# Patient Record
Sex: Female | Born: 1965 | Marital: Married | Smoking: Never smoker
Health system: Southern US, Community
[De-identification: ages and names within clinical notes are randomized; demographics above are authoritative.]

## PROBLEM LIST (undated history)

## (undated) DIAGNOSIS — I10 Essential (primary) hypertension: Secondary | ICD-10-CM

## (undated) DIAGNOSIS — R7303 Prediabetes: Secondary | ICD-10-CM

## (undated) HISTORY — DX: Prediabetes: R73.03

## (undated) HISTORY — DX: Essential (primary) hypertension: I10

---

## 2006-06-14 DIAGNOSIS — I10 Essential (primary) hypertension: Secondary | ICD-10-CM

## 2006-06-14 HISTORY — DX: Essential (primary) hypertension: I10

## 2014-01-17 ENCOUNTER — Emergency Department (HOSPITAL_COMMUNITY)
Admission: EM | Admit: 2014-01-17 | Discharge: 2014-01-17 | Disposition: A | Discharge status: Home / Self Care | Payer: Self-pay | Source: Home / Self Care | Attending: Family Medicine | Admitting: Family Medicine

## 2014-01-17 ENCOUNTER — Encounter (HOSPITAL_COMMUNITY): Payer: Self-pay | Admitting: Emergency Medicine

## 2014-01-17 DIAGNOSIS — R42 Dizziness and giddiness: Secondary | ICD-10-CM

## 2014-01-17 LAB — POCT I-STAT, CHEM 8
BUN: 16 mg/dL (ref 6–23)
Calcium, Ion: 1.27 mmol/L — ABNORMAL HIGH (ref 1.12–1.23)
Chloride: 105 mEq/L (ref 96–112)
Creatinine, Ser: 1 mg/dL (ref 0.50–1.10)
GLUCOSE: 83 mg/dL (ref 70–99)
HEMATOCRIT: 41 % (ref 36.0–46.0)
Hemoglobin: 13.9 g/dL (ref 12.0–15.0)
POTASSIUM: 4 meq/L (ref 3.7–5.3)
Sodium: 142 mEq/L (ref 137–147)
TCO2: 25 mmol/L (ref 0–100)

## 2014-01-17 LAB — HEMOGLOBIN A1C
HEMOGLOBIN A1C: 6.2 % — AB (ref <5.7)
Mean Plasma Glucose: 131 mg/dL — ABNORMAL HIGH (ref <117)

## 2014-01-17 LAB — TSH: TSH: 1.64 u[IU]/mL (ref 0.350–4.500)

## 2014-01-17 NOTE — Discharge Instructions (Signed)
If your other lab tests come back abnormal, a nurse will call you. If your tests are normal, you will not get a phone call.   Follow up with the [facility] for future health care. They will work with you without health insurance.    Dizziness Dizziness is a common problem. It is a feeling of unsteadiness or light-headedness. You may feel like you are about to faint. Dizziness can lead to injury if you stumble or fall. A person of any age group can suffer from dizziness, but dizziness is more common in older adults. CAUSES  Dizziness can be caused by many different things, including:  Middle ear problems.  Standing for too long.  Infections.  An allergic reaction.  Aging.  An emotional response to something, such as the sight of blood.  Side effects of medicines.  Tiredness.  Problems with circulation or blood pressure.  Excessive use of alcohol or medicines, or illegal drug use.  Breathing too fast (hyperventilation).  An irregular heart rhythm (arrhythmia).  A low red blood cell count (anemia).  Pregnancy.  Vomiting, diarrhea, fever, or other illnesses that cause body fluid loss (dehydration).  Diseases or conditions such as Parkinson's disease, high blood pressure (hypertension), diabetes, and thyroid problems.  Exposure to extreme heat. DIAGNOSIS  Your health care provider will ask about your symptoms, perform a physical exam, and perform an electrocardiogram (ECG) to record the electrical activity of your heart. Your health care provider may also perform other heart or blood tests to determine the cause of your dizziness. These may include:  Transthoracic echocardiogram (TTE). During echocardiography, sound waves are used to evaluate how blood flows through your heart.  Transesophageal echocardiogram (TEE).  Cardiac monitoring. This allows your health care provider to monitor your heart rate and rhythm in real time.  Holter  monitor. This is a portable device that records your heartbeat and can help diagnose heart arrhythmias. It allows your health care provider to track your heart activity for several days if needed.  Stress tests by exercise or by giving medicine that makes the heart beat faster. TREATMENT  Treatment of dizziness depends on the cause of your symptoms and can vary greatly. HOME CARE INSTRUCTIONS   Drink enough fluids to keep your urine clear or pale yellow. This is especially important in very hot weather. In older adults, it is also important in cold weather.  Take your medicine exactly as directed if your dizziness is caused by medicines. When taking blood pressure medicines, it is especially important to get up slowly.  Rise slowly from chairs and steady yourself until you feel okay.  In the morning, first sit up on the side of the bed. When you feel okay, stand slowly while holding onto something until you know your balance is fine.  Move your legs often if you need to stand in one place for a long time. Tighten and relax your muscles in your legs while standing.  Have someone stay with you for 1-2 days if dizziness continues to be a problem. Do this until you feel you are well enough to stay alone. Have the person call your health care provider if he or she notices changes in you that are concerning.  Do not drive or use heavy machinery if you feel dizzy.  Do not drink alcohol. SEEK IMMEDIATE MEDICAL CARE IF:   Your dizziness or light-headedness gets worse.  You feel nauseous or vomit.  You have problems talking, walking, or  using your arms, hands, or legs.  You feel weak.  You are not thinking clearly or you have trouble forming sentences. It may take a friend or family member to notice this.  You have chest pain, abdominal pain, shortness of breath, or sweating.  Your vision changes.  You notice any bleeding.  You have side effects from medicine that seems to be getting  worse rather than better. MAKE SURE YOU:   Understand these instructions.  Will watch your condition.  Will get help right away if you are not doing well or get worse. Document Released: 11/24/2000 Document Revised: 06/05/2013 Document Reviewed: 12/18/2010 [facility] Patient Information 2015 [place], [place]. This information is not intended to replace advice given to you by your health care provider. Make sure you discuss any questions you have with your health care provider.

## 2014-01-17 NOTE — ED Provider Notes (Signed)
CSN: [id number]     Arrival date & time 01/17/14  1040 History   First MD Initiated Contact with Patient 01/17/14 1224     Chief Complaint  Patient presents with  . Headache   (Consider location/radiation/quality/duration/timing/severity/associated sxs/prior Treatment) HPI Comments: Pt reports her blood sugar was "a little high" last year after loosing weight unexpectedly.  She has changed her diet to reduce carbs and has continued to lose weight unintentionally. She also has had occasional lightheadedness on and off for months. She has noticed it more in the last 2 days and wants her blood sugar checked again because she thinks she must have diabetes. Lightheadedness does not interfere with life, she just notices it sometimes. Does not have pcp or insurance.   Patient is a 48 y.o. female presenting with dizziness. The history is provided by the patient.  Dizziness Quality:  Lightheadedness Severity:  Mild Onset quality:  Sudden Duration: on and off for several months. Timing:  Sporadic Progression:  Unchanged Chronicity:  New Context: not when bending over, not with head movement, not with loss of consciousness, not with medication and not with physical activity   Relieved by:  None tried Worsened by:  Nothing tried Ineffective treatments:  None tried Associated symptoms: no chest pain, no diarrhea, no headaches, no palpitations, no shortness of breath, no syncope, no vomiting and no weakness     History reviewed. No pertinent past medical history. Past Surgical History  Procedure Laterality Date  . Cesarean section     History reviewed. No pertinent family history. History  Substance Use Topics  . Smoking status: Never Smoker   . Smokeless tobacco: Not on file  . Alcohol Use: No   OB History   Grav Para Term Preterm Abortions TAB SAB Ect Mult Living                 Review of Systems  Constitutional: Negative for chills, activity change, appetite change and fatigue.   Respiratory: Negative for shortness of breath.   Cardiovascular: Negative for chest pain, palpitations and syncope.  Gastrointestinal: Negative for vomiting and diarrhea.  Endocrine: Positive for polydipsia and polyuria. Negative for cold intolerance, heat intolerance and polyphagia.  Neurological: Positive for dizziness and light-headedness. Negative for syncope and headaches.    Allergies  Review of patient's allergies indicates no known allergies.  Home Medications   Prior to Admission medications   Not on File   BP 138/97  Pulse 75  Temp(Src) 98.6 F (37 C) (Oral)  Resp 18  SpO2 100%  LMP 01/17/2014 Physical Exam  Constitutional: She appears well-developed and well-nourished.  Neck: Neck supple. No thyromegaly present.  Cardiovascular: Normal rate and regular rhythm.   No peripheral edema  Pulmonary/Chest: Effort normal and breath sounds normal.  Lymphadenopathy:    She has no cervical adenopathy.    ED Course  Procedures (including critical care time) Labs Review Labs Reviewed  POCT I-STAT, CHEM 8 - Abnormal; Notable for the following:    Calcium, Ion 1.27 (*)    All other components within normal limits  TSH  HEMOGLOBIN A1C    Imaging Review No results found.   MDM   1. Dizziness   glucose, hgb/hct normal on istat. Not orthostatic. Drew TSH and hgbA1C. [facility] for further care.      Cathlyn ParsonsAngela M Kabbe, NP 01/17/14 1230

## 2014-01-17 NOTE — ED Notes (Signed)
Pt  Reports  Symptoms  Of  Headache   /  dizzyness        X  2  Days   -  Wants tio  Be  Checked  For  Diabetes      Headache  Is  Mild  -  No  Photophobia      No  Vomiting     Pt is  Awake  And  Alert  And  Oriented         Skin is  Warm  And  Dry

## 2014-01-21 NOTE — ED Notes (Signed)
TSH 1.640, Hgb A 1 C 6.2 H, mean glucose 131 H.  Message sent to Dr. Denyse Amassorey. Vassie MoselleYork, Milana Salay M 01/21/2014

## 2014-01-23 ENCOUNTER — Telehealth (HOSPITAL_COMMUNITY): Payer: Self-pay | Admitting: *Deleted

## 2014-01-23 NOTE — ED Notes (Signed)
Dr. Denyse Amassorey said to notify the pt. of her results.  I called pt.  Pt. verified x 2 and given results.  Pt. told that her Hgb A1C shows she is pre-diabetic. I said it would have to be greater that 6.5 to be diabetes, but she needs to f/u with her PCP if she has one.  She said she sees Dr. Julio Sickssei Bonsu. (I added his name to her chart as her PCP).   I told her that if he is not on EPIC that we could fax her lab results to him.  She voiced understanding. Vassie MoselleYork, Nikoloz Huy M 01/23/2014

## 2014-02-05 NOTE — ED Provider Notes (Signed)
Medical screening examination/treatment/procedure(s) were performed by a resident physician or non-physician practitioner and as the supervising physician I was immediately available for consultation/collaboration.  Clementeen Graham, MD   Rodolph Bong, MD 02/05/14 [phone]

## 2014-02-13 ENCOUNTER — Ambulatory Visit: Payer: Self-pay

## 2014-08-19 ENCOUNTER — Emergency Department (INDEPENDENT_AMBULATORY_CARE_PROVIDER_SITE_OTHER)
Admission: EM | Admit: 2014-08-19 | Discharge: 2014-08-19 | Disposition: A | Discharge status: Home / Self Care | Payer: Self-pay | Source: Home / Self Care | Attending: Family Medicine | Admitting: Family Medicine

## 2014-08-19 ENCOUNTER — Encounter (HOSPITAL_COMMUNITY): Payer: Self-pay | Admitting: *Deleted

## 2014-08-19 DIAGNOSIS — Z041 Encounter for examination and observation following transport accident: Secondary | ICD-10-CM

## 2014-08-19 DIAGNOSIS — M25531 Pain in right wrist: Secondary | ICD-10-CM
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
## 2014-08-19 NOTE — Discharge Instructions (Signed)
Wear splint as needed, see dr Mina Marbleweingold if further wrist problems.
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
## 2014-08-26 ENCOUNTER — Other Ambulatory Visit: Payer: Self-pay

## 2014-08-26 DIAGNOSIS — Z1231 Encounter for screening mammogram for malignant neoplasm of breast: Secondary | ICD-10-CM

## 2015-02-14 ENCOUNTER — Ambulatory Visit: Payer: Self-pay | Attending: Internal Medicine

## 2015-02-24 ENCOUNTER — Ambulatory Visit: Payer: Self-pay | Attending: Internal Medicine

## 2015-04-10 ENCOUNTER — Ambulatory Visit: Payer: Self-pay | Attending: Internal Medicine | Admitting: Internal Medicine

## 2015-04-10 ENCOUNTER — Encounter: Payer: Self-pay | Admitting: Internal Medicine

## 2015-04-10 VITALS — BP 144/94 | HR 81 | Temp 98.8°F | Resp 16 | Wt 204.0 lb

## 2015-04-10 DIAGNOSIS — Z8632 Personal history of gestational diabetes: Secondary | ICD-10-CM | POA: Insufficient documentation

## 2015-04-10 DIAGNOSIS — Z0000 Encounter for general adult medical examination without abnormal findings: Secondary | ICD-10-CM

## 2015-04-10 DIAGNOSIS — Z23 Encounter for immunization: Secondary | ICD-10-CM | POA: Insufficient documentation

## 2015-04-10 DIAGNOSIS — R7303 Prediabetes: Secondary | ICD-10-CM | POA: Insufficient documentation

## 2015-04-10 LAB — POCT GLYCOSYLATED HEMOGLOBIN (HGB A1C): Hemoglobin A1C: 6

## 2015-04-10 NOTE — Progress Notes (Signed)
Patient here to establish care Currently taking no prescribed medications Patient did state she has been diagnosed with diabetes but Takes no medication for it Controls it with diet and exercise

## 2015-04-10 NOTE — Progress Notes (Signed)
Patient ID: Zakari Couchman, female   DOB: Mar 09, 1966, 49 y.o.   MRN: [id number]  CC: wellness check  HPI: Kay Shippy is a 49 y.o. female here today for a follow up visit.  Patient has past medical history of gestational diabetes. She reports that 6 years ago while pregnant she was diagnosed with gestational diabetes which resolved after giving birth. She states that she was checked again and was told that she needed to lose weight and change her diet to lower her A1C. Today she is requesting to be checked to see if she needs medications. She denies polyuria, urinary frequency, blurred vision, or neuropathy.  Her only complaint is fatigued but she had a full physical in January and states that all of her blood test were normal. No complaints today.  No Known Allergies Past Medical History  Diagnosis Date  . Diabetes mellitus without complication (HCC)    No current outpatient prescriptions on file prior to visit.   No current facility-administered medications on file prior to visit.   History reviewed. No pertinent family history. Social History   Social History  . Marital Status: Married    Spouse Name: N/A  . Number of Children: N/A  . Years of Education: N/A   Occupational History  . Not on file.   Social History Main Topics  . Smoking status: Never Smoker   . Smokeless tobacco: Not on file  . Alcohol Use: No  . Drug Use: Not on file  . Sexual Activity: Not on file   Other Topics Concern  . Not on file   Social History Narrative    Review of Systems: Constitutional: Negative for fever, chills, diaphoresis, activity change, appetite change and fatigue. HENT: Negative for ear pain, nosebleeds, congestion, facial swelling, rhinorrhea, neck pain, neck stiffness and ear discharge.  Eyes: Negative for pain, discharge, redness, itching and visual disturbance. Respiratory: Negative for cough, choking, chest tightness, shortness of breath, wheezing and stridor.   Cardiovascular: Negative for chest pain, palpitations and leg swelling. Gastrointestinal: Negative for abdominal distention. Genitourinary: Negative for dysuria, urgency, frequency, hematuria, flank pain, decreased urine volume, difficulty urinating and dyspareunia.  Musculoskeletal: Negative for back pain, joint swelling, arthralgias and gait problem. Neurological: Negative for dizziness, tremors, seizures, syncope, facial asymmetry, speech difficulty, weakness, light-headedness, numbness and headaches.  Hematological: Negative for adenopathy. Does not bruise/bleed easily. Psychiatric/Behavioral: Negative for hallucinations, behavioral problems, confusion, dysphoric mood, decreased concentration and agitation.    Objective:   Filed Vitals:   04/10/15 1101  BP: 144/94  Pulse: 81  Temp: 98.8 F (37.1 C)  Resp: 16    Physical Exam: Constitutional: Patient appears well-developed and well-nourished. No distress. HENT: Normocephalic, atraumatic, External right and left ear normal. Oropharynx is clear and moist.  Eyes: Conjunctivae and EOM are normal. PERRLA, no scleral icterus. Neck: Normal ROM. Neck supple. No JVD. No tracheal deviation. No thyromegaly. CVS: RRR, S1/S2 +, no murmurs, no gallops, no carotid bruit.  Pulmonary: Effort and breath sounds normal, no stridor, rhonchi, wheezes, rales.  Abdominal: Soft. BS +,  no distension, tenderness, rebound or guarding.  Musculoskeletal: Normal range of motion. No edema and no tenderness.  Lymphadenopathy: No lymphadenopathy noted, cervical, inguinal or axillary Neuro: Alert. Normal reflexes, muscle tone coordination. No cranial nerve deficit. Skin: Skin is warm and dry. No rash noted. Not diaphoretic. No erythema. No pallor. Psychiatric: Normal mood and affect. Behavior, judgment, thought content normal.  Lab Results  Component Value Date   HGB 13.9 01/17/2014   HCT  41.0 01/17/2014   Lab Results  Component Value Date   CREATININE  1.00 01/17/2014   BUN 16 01/17/2014   NA 142 01/17/2014   K 4.0 01/17/2014   CL 105 01/17/2014    Lab Results  Component Value Date   HGBA1C 6.0 04/10/2015   Lipid Panel  No results found for: CHOL, TRIG, HDL, CHOLHDL, VLDL, LDLCALC     Assessment and plan:   [name] was seen today for establish care.  Diagnoses and all orders for this visit:  Annual physical exam Will have labs and pelvic exam sent to office for review  Prediabetes -     HgB A1c -     Flu Vaccine QUAD 36+ mos PF IM (Fluarix & Fluzone Quad PF) Weight loss, exercise, and diet discussed in detail.    Return in about 6 months (around 10/09/2015) for prediabetes .        Ambrose FinlandValerie A Keck, NP-C [facility] [phone] 04/10/2015, 11:08 AM

## 2015-07-04 ENCOUNTER — Encounter: Payer: Self-pay | Admitting: Internal Medicine

## 2015-07-04 ENCOUNTER — Ambulatory Visit: Payer: Self-pay | Attending: Internal Medicine | Admitting: Internal Medicine

## 2015-07-04 VITALS — BP 127/83 | HR 74 | Temp 98.0°F | Resp 16 | Ht 69.0 in | Wt 204.6 lb

## 2015-07-04 DIAGNOSIS — M25562 Pain in left knee: Secondary | ICD-10-CM | POA: Insufficient documentation

## 2015-07-04 DIAGNOSIS — M25561 Pain in right knee: Secondary | ICD-10-CM | POA: Insufficient documentation

## 2015-07-04 MED ORDER — MELOXICAM 15 MG PO TABS: 15.0000 mg | ORAL_TABLET | Freq: Every day | ORAL | Status: DC

## 2015-07-04 MED ORDER — TRAMADOL HCL 50 MG PO TABS: 50.0000 mg | ORAL_TABLET | Freq: Three times a day (TID) | ORAL | Status: DC | PRN

## 2015-07-04 NOTE — Progress Notes (Signed)
Patient complains of having bilateral Knee pain that started to bother her again about  Three weeks ago Has been using OTC pain meds with little relief

## 2015-07-04 NOTE — Patient Instructions (Signed)
Call me after 2 weeks if knee pain has not improved or becomes worse. You may later require Orthopedics

## 2015-07-04 NOTE — Progress Notes (Signed)
   Subjective:    Patient ID: Kristen George, female    DOB: 10/04/1965, 50 y.o.   MRN: [id number]  HPI Comments: Pain has eased off over the past 2 days but still hurts when she attempts to stretch her left leg out. Left knee worse than right. Feels like something is pulling in her knees when outstretched.   Knee Pain  The incident occurred more than 1 week ago. There was no injury mechanism. The pain is present in the left knee. The pain is at a severity of 0/10. The pain is severe. The pain has been constant since onset. Pertinent negatives include no inability to bear weight or loss of motion. The symptoms are aggravated by movement. She has tried NSAIDs for the symptoms. The treatment provided moderate relief.      Review of Systems  All other systems reviewed and are negative.      Objective:   Physical Exam  Constitutional: She is oriented to person, place, and time.  Musculoskeletal: Normal range of motion. She exhibits tenderness (patella). She exhibits no edema.  Mild swelling to left knee, no crepitus  Neurological: She is alert and oriented to person, place, and time.       Assessment & Plan:  Kristen George was seen today for knee pain.  Diagnoses and all orders for this visit:  Arthralgia of both knees -     traMADol (ULTRAM) 50 MG tablet; Take 1 tablet (50 mg total) by mouth every 8 (eight) hours as needed. -     meloxicam (MOBIC) 15 MG tablet; Take 1 tablet (15 mg total) by mouth daily. She will use heat, stretches, and NSAID's. If no improvement she may call back. May later require Orthopedics  Return if symptoms worsen or fail to improve.  Ambrose Finland, NP 07/04/2015 12:18 PM

## 2015-12-17 ENCOUNTER — Ambulatory Visit: Payer: Self-pay | Attending: Internal Medicine

## 2016-03-02 ENCOUNTER — Encounter: Payer: Self-pay | Admitting: Family Medicine

## 2016-03-02 ENCOUNTER — Ambulatory Visit: Payer: Self-pay | Attending: Family Medicine | Admitting: Family Medicine

## 2016-03-02 DIAGNOSIS — I10 Essential (primary) hypertension: Secondary | ICD-10-CM

## 2016-03-02 DIAGNOSIS — M25562 Pain in left knee: Secondary | ICD-10-CM

## 2016-03-02 DIAGNOSIS — R7303 Prediabetes: Secondary | ICD-10-CM

## 2016-03-02 DIAGNOSIS — M25561 Pain in right knee: Secondary | ICD-10-CM

## 2016-03-02 DIAGNOSIS — Z23 Encounter for immunization: Secondary | ICD-10-CM

## 2016-03-02 LAB — GLUCOSE, POCT (MANUAL RESULT ENTRY): POC Glucose: 87 mg/dl (ref 70–99)

## 2016-03-02 LAB — POCT GLYCOSYLATED HEMOGLOBIN (HGB A1C): HEMOGLOBIN A1C: 6

## 2016-03-02 MED ORDER — MELOXICAM 7.5 MG PO TABS: 7.5000 mg | ORAL_TABLET | Freq: Every day | ORAL | 0 refills | Status: DC

## 2016-03-02 MED ORDER — AMLODIPINE BESYLATE 2.5 MG PO TABS: 2.5000 mg | ORAL_TABLET | Freq: Every day | ORAL | 3 refills | Status: DC

## 2016-03-02 NOTE — Patient Instructions (Addendum)
Kristen George was seen today for re-establish care.  Diagnoses and all orders for this visit:  Essential hypertension -     amLODipine (NORVASC) 2.5 MG tablet; Take 1 tablet (2.5 mg total) by mouth daily.  Prediabetes -     POCT glycosylated hemoglobin (Hb A1C) -     Glucose (CBG)  Bilateral knee pain -     meloxicam (MOBIC) 7.5 MG tablet; Take 1 tablet (7.5 mg total) by mouth daily.   F/u in 6 weeks for BP check and for knee pain  Dr. Armen PickupFunches

## 2016-03-02 NOTE — Progress Notes (Signed)
LOGO@  Subjective:  Patient ID: Kristen George, female    DOB: 09-Apr-1966  Age: 50 y.o. MRN: [id number]  CC: Re-Establish Care   HPI Kristen George presents for   1. HTN: had some elevated BP early in life. Was HTN in pregnancy 2008. Has intermittently been on antihypertensives. Last time was 3-11/2015 while in [place] 5 mg of amlodipine for   2. Prediabetes: has hx of gestational diabetes in 2010. Was not chronically diabetic. She was never treated with pharmacotherapy.   3. Bilateral knee pain: she use to weigh 240 #. She lost weight about 2 months she is having trouble climbing stairs with anterior knee pain. No knee swelling. No injury.   Past Medical History:  Diagnosis Date  . Gestational diabetes 06/14/2008  . Hypertension 06/14/2006    Past Surgical History:  Procedure Laterality Date  . CESAREAN SECTION      No family history on file.  Social History  Substance Use Topics  . Smoking status: Never Smoker  . Smokeless tobacco: Not on file  . Alcohol use No    ROS Review of Systems  Constitutional: Negative for chills and fever.  Eyes: Negative for visual disturbance.  Respiratory: Negative for shortness of breath.   Cardiovascular: Negative for chest pain.  Gastrointestinal: Negative for abdominal pain and blood in stool.  Musculoskeletal: Positive for arthralgias. Negative for back pain.  Skin: Negative for rash.  Allergic/Immunologic: Negative for immunocompromised state.  Hematological: Negative for adenopathy. Does not bruise/bleed easily.  Psychiatric/Behavioral: Negative for dysphoric mood and suicidal ideas.    Objective:   Today's Vitals: BP 136/86   Pulse 77   Temp 98.1 F (36.7 C) (Oral)   Ht 5\' 9"  (1.753 m)   Wt 204 lb 3.2 oz (92.6 kg)   LMP 02/17/2016 (Exact Date)   BMI 30.16 kg/m   Physical Exam  Constitutional: She is oriented to person, place, and time. She appears well-developed and well-nourished. No distress.  HENT:  Head:  Normocephalic and atraumatic.  Cardiovascular: Normal rate, regular rhythm, normal heart sounds and intact distal pulses.   Pulmonary/Chest: Effort normal and breath sounds normal.  Musculoskeletal: She exhibits no edema.       Right knee: She exhibits normal range of motion, no swelling and no effusion. No tenderness found.       Left knee: She exhibits normal range of motion and no swelling. No tenderness found.  Crepitus in both knees   Neurological: She is alert and oriented to person, place, and time.  Skin: Skin is warm and dry. No rash noted.  Psychiatric: She has a normal mood and affect.   Lab Results  Component Value Date   HGBA1C 6.0 04/10/2015   Lab Results  Component Value Date   HGBA1C 6.0 04/10/2015    CBG 87 Assessment & Plan:   Problem List Items Addressed This Visit      Unprioritized   Prediabetes   Relevant Orders   POCT glycosylated hemoglobin (Hb A1C) (Completed)   Glucose (CBG) (Completed)   HTN (hypertension)   Relevant Medications   amLODipine (NORVASC) 2.5 MG tablet   Bilateral knee pain   Relevant Medications   meloxicam (MOBIC) 7.5 MG tablet    Other Visit Diagnoses    Encounter for immunization       Relevant Orders   Flu Vaccine QUAD 36+ mos IM (Completed)      Outpatient Encounter Prescriptions as of 03/02/2016  Medication Sig  . meloxicam (MOBIC) 15 MG tablet  Take 1 tablet (15 mg total) by mouth daily. (Patient not taking: Reported on 03/02/2016)  . traMADol (ULTRAM) 50 MG tablet Take 1 tablet (50 mg total) by mouth every 8 (eight) hours as needed. (Patient not taking: Reported on 03/02/2016)   No facility-administered encounter medications on file as of 03/02/2016.     Follow-up: No Follow-up on file.    Dessa Phi MD

## 2016-03-07 NOTE — Assessment & Plan Note (Signed)
A: chronic HTN P: Add norvasc 2.5 mg daily Low salt diet Weight loss

## 2016-03-07 NOTE — Assessment & Plan Note (Signed)
Chronic b/l knee pain suspect primary OA Plan: mobic prn Weight loss

## 2016-03-07 NOTE — Assessment & Plan Note (Signed)
Hx of gestational diabetes that did not continue into chronic diabetes. She is pre-diabetic   Plan: Weight loss Yearly monitoring

## 2016-03-22 ENCOUNTER — Ambulatory Visit: Payer: Self-pay | Attending: Family Medicine

## 2016-04-14 MED FILL — AMLODIPINE BESYLATE 2.5 MG: 2.5 | 30 days supply | Qty: 30 | Fill #0

## 2016-04-14 MED FILL — ?MELOXICAM 7.5 MG TABLET: 7.5 | 30 days supply | Qty: 30 | Fill #0

## 2016-08-06 ENCOUNTER — Ambulatory Visit: Payer: Self-pay | Attending: Family Medicine

## 2016-08-12 ENCOUNTER — Ambulatory Visit: Payer: Self-pay | Admitting: Family Medicine

## 2016-08-24 ENCOUNTER — Ambulatory Visit: Payer: Self-pay | Admitting: Family Medicine

## 2016-08-31 ENCOUNTER — Encounter: Payer: Self-pay | Admitting: Family Medicine

## 2016-08-31 ENCOUNTER — Ambulatory Visit: Payer: Self-pay | Attending: Family Medicine | Admitting: Family Medicine

## 2016-08-31 VITALS — BP 145/83 | HR 86 | Temp 98.1°F | Ht 69.0 in | Wt 204.8 lb

## 2016-08-31 DIAGNOSIS — I10 Essential (primary) hypertension: Secondary | ICD-10-CM | POA: Insufficient documentation

## 2016-08-31 DIAGNOSIS — Z79899 Other long term (current) drug therapy: Secondary | ICD-10-CM | POA: Insufficient documentation

## 2016-08-31 DIAGNOSIS — S025XXA Fracture of tooth (traumatic), initial encounter for closed fracture: Secondary | ICD-10-CM | POA: Insufficient documentation

## 2016-08-31 DIAGNOSIS — X58XXXA Exposure to other specified factors, initial encounter: Secondary | ICD-10-CM | POA: Insufficient documentation

## 2016-08-31 DIAGNOSIS — R7303 Prediabetes: Secondary | ICD-10-CM | POA: Insufficient documentation

## 2016-08-31 LAB — GLUCOSE, POCT (MANUAL RESULT ENTRY): POC Glucose: 138 mg/dl — AB (ref 70–99)

## 2016-08-31 LAB — POCT GLYCOSYLATED HEMOGLOBIN (HGB A1C): HEMOGLOBIN A1C: 6

## 2016-08-31 MED ORDER — AMLODIPINE BESYLATE 2.5 MG PO TABS: 2.5000 mg | ORAL_TABLET | Freq: Every day | ORAL | 5 refills | Status: DC

## 2016-08-31 MED FILL — AMLODIPINE BESYLATE 2.5 MG: 2.5 | 30 days supply | Qty: 30 | Fill #0

## 2016-08-31 NOTE — Assessment & Plan Note (Signed)
Uncontrolled HTN Med: none P; Restart Norvasc 2.5 mg daily Incorporate regular exercise

## 2016-08-31 NOTE — Assessment & Plan Note (Signed)
Stable A1c of 6 Incorporate exercise

## 2016-08-31 NOTE — Progress Notes (Signed)
Pt is here to check diabetes.

## 2016-08-31 NOTE — Progress Notes (Signed)
Subjective:  Patient ID: Kristen George, female    DOB: September 17, 1965  Age: 51 y.o. MRN: [id number]  CC: Diabetes   HPI Kristen George presents for   1. Prediabetes: her A1c was 6.0 at last visit. Noticing pinching sensation in finger tips and toes about one month ago. She is not exercising. She does eat a low sugar diet.   2. HTN: she is not taking Norvasc. She checks her blood pressure at home range  125-137/74-90. She noticed elevated BP 2 months ago.   3. Chipped tooth: initially chipped 10 years ago. She had it repaired. Chipped again 4 months ago. No pain or swelling. Request dental referral.   Social History  Substance Use Topics  . Smoking status: Never Smoker  . Smokeless tobacco: Not on file  . Alcohol use No    Outpatient Medications Prior to Visit  Medication Sig Dispense Refill  . amLODipine (NORVASC) 2.5 MG tablet Take 1 tablet (2.5 mg total) by mouth daily. (Patient not taking: Reported on 08/31/2016) 90 tablet 3  . meloxicam (MOBIC) 7.5 MG tablet Take 1 tablet (7.5 mg total) by mouth daily. (Patient not taking: Reported on 08/31/2016) 30 tablet 0   No facility-administered medications prior to visit.     ROS Review of Systems  Constitutional: Negative for chills and fever.  HENT: Positive for dental problem.   Eyes: Negative for visual disturbance.  Respiratory: Negative for shortness of breath.   Cardiovascular: Negative for chest pain.  Gastrointestinal: Negative for abdominal pain and blood in stool.  Musculoskeletal: Negative for arthralgias and back pain.  Skin: Negative for rash.  Allergic/Immunologic: Negative for immunocompromised state.  Hematological: Negative for adenopathy. Does not bruise/bleed easily.  Psychiatric/Behavioral: Negative for dysphoric mood and suicidal ideas.    Objective:  BP (!) 145/83   Pulse 86   Temp 98.1 F (36.7 C) (Oral)   Ht 5\' 9"  (1.753 m)   Wt 204 lb 12.8 oz (92.9 kg)   SpO2 98%   BMI 30.24 kg/m   BP/Weight  08/31/2016 03/02/2016 07/04/2015  Systolic BP 145 136 127  Diastolic BP 83 86 83  Wt. (Lbs) 204.8 204.2 204.6  BMI 30.24 30.16 30.2    Physical Exam  Constitutional: She is oriented to person, place, and time. She appears well-developed and well-nourished. No distress.  HENT:  Head: Normocephalic and atraumatic.  Mouth/Throat:    Cardiovascular: Normal rate, regular rhythm, normal heart sounds and intact distal pulses.   Pulmonary/Chest: Effort normal and breath sounds normal.  Musculoskeletal: She exhibits no edema.  Neurological: She is alert and oriented to person, place, and time.  Skin: Skin is warm and dry. No rash noted.  Psychiatric: She has a normal mood and affect.   Lab Results  Component Value Date   HGBA1C 6.0 03/02/2016   CBG 138   Assessment & Plan:   Kristen George was seen today for diabetes.  Diagnoses and all orders for this visit:  Prediabetes -     POCT glucose (manual entry) -     POCT glycosylated hemoglobin (Hb A1C)  Essential hypertension -     amLODipine (NORVASC) 2.5 MG tablet; Take 1 tablet (2.5 mg total) by mouth daily.  Closed fracture of tooth, initial encounter -     Ambulatory referral to Dentistry     No orders of the defined types were placed in this encounter.   Follow-up: Return in about 3 months (around 12/01/2016) for HTN .   Dessa PhiJosalyn Kauri Garson MD

## 2016-08-31 NOTE — Patient Instructions (Addendum)
Kristen George was seen today for diabetes.  Diagnoses and all orders for this visit:  Prediabetes -     POCT glucose (manual entry) -     POCT glycosylated hemoglobin (Hb A1C)  Essential hypertension -     amLODipine (NORVASC) 2.5 MG tablet; Take 1 tablet (2.5 mg total) by mouth daily.  Closed fracture of tooth, initial encounter -     Ambulatory referral to Dentistry  please incorporate 30-45 minutes of exercise most days of the week to reduce stress, blood pressure and blood sugar    Follow up in 3 months for BP check, sooner if needed   Dr. Armen PickupFunches

## 2016-09-28 ENCOUNTER — Telehealth: Payer: Self-pay | Admitting: Family Medicine

## 2016-09-28 NOTE — Telephone Encounter (Signed)
Patient requesting referral to gynecologist

## 2016-09-29 NOTE — Telephone Encounter (Signed)
attempted to call pt but there was no answer.

## 2016-09-30 NOTE — Telephone Encounter (Signed)
Please ask about the reason for the referral Will likely need an OV for gyn exam, she is due for screening pap smear

## 2016-10-01 NOTE — Telephone Encounter (Signed)
Contacted pt to see why she is needing the referral and to possible schedule an appointment with DR. Funches. Pt didn't answer lvm regarding information and for her to give [name] a call back at her earliest convenience

## 2016-10-25 NOTE — Telephone Encounter (Signed)
Patient came to the office to check on the status of her GYN referral. Explained to patient that nurse had been trying to contact her. Patient stated that the reason for the referral is that she is experiencing heavy menstrual bleeding. Pt is concerned. Please follow up.  Pt gave the verbal ok to leave her a message if she doesn't answer.   Thank you

## 2016-10-25 NOTE — Telephone Encounter (Signed)
Will forward to pcp

## 2016-10-25 NOTE — Telephone Encounter (Signed)
Per PCP's last note she does need an office visit for a GYN exam. Please schedule her.

## 2016-10-26 ENCOUNTER — Encounter: Payer: Self-pay | Admitting: Family Medicine

## 2016-10-26 ENCOUNTER — Telehealth: Payer: Self-pay | Admitting: Family Medicine

## 2016-10-26 NOTE — Telephone Encounter (Signed)
Yes, patient should come see me in office for pap/heavy menstrual periods.

## 2016-10-26 NOTE — Telephone Encounter (Signed)
Amy could you schedule please for Dr. Armen PickupFunches

## 2016-10-26 NOTE — Telephone Encounter (Signed)
Called pt. To schedule appt. With PCP. When pt. Calls back schedule appt. On same day slots for heavy menstrual periods.

## 2016-10-27 ENCOUNTER — Encounter: Payer: Self-pay | Admitting: Family Medicine

## 2016-11-05 ENCOUNTER — Ambulatory Visit (HOSPITAL_COMMUNITY)
Admission: EM | Admit: 2016-11-05 | Discharge: 2016-11-05 | Disposition: A | Discharge status: Home / Self Care | Payer: Self-pay | Attending: Internal Medicine | Admitting: Internal Medicine

## 2016-11-05 ENCOUNTER — Encounter (HOSPITAL_COMMUNITY): Payer: Self-pay | Admitting: *Deleted

## 2016-11-05 DIAGNOSIS — R21 Rash and other nonspecific skin eruption: Secondary | ICD-10-CM

## 2016-11-05 DIAGNOSIS — L259 Unspecified contact dermatitis, unspecified cause: Secondary | ICD-10-CM

## 2016-11-05 MED ORDER — PREDNISONE 10 MG PO TABS: ORAL_TABLET | ORAL | 0 refills | Status: DC

## 2016-11-05 MED FILL — predniSONE 10 MG TABS: 10 | 6 days supply | Qty: 21 | Fill #0

## 2016-11-05 MED FILL — ?AMLODIPINE BESYLATE 2.5MG: 2.5 | 30 days supply | Qty: 30 | Fill #1

## 2016-11-05 NOTE — ED Triage Notes (Signed)
Pt   Reports  Rash  On  Neck   And behind  Both   Ears     X   sev  Weeks  The  Rash  Itches  But  Is  Not  painfull     She   denys  Any  New  Medications  Or  Any  Known  Causative   Agent       Pt  Sitting  Upright on the  Exam table  Speaking in  Complete   sentances

## 2016-11-05 NOTE — Discharge Instructions (Signed)
See your Physicain for recheck in 1 week if symptoms persist °

## 2016-11-05 NOTE — ED Provider Notes (Signed)
CSN: [id number]     Arrival date & time 11/05/16  1101 History   None    Chief Complaint  Patient presents with  . Rash   (Consider location/radiation/quality/duration/timing/severity/associated sxs/prior Treatment) The history is provided by the patient. No language interpreter was used.  Rash  Location: chest. Quality: redness   Severity:  Moderate Onset quality:  Gradual Timing:  Constant Progression:  Worsening Chronicity:  New Context: not animal contact, not chemical exposure, not insect bite/sting and not plant contact   Relieved by:  Nothing Worsened by:  Nothing Ineffective treatments:  None tried Associated symptoms: no shortness of breath     Past Medical History:  Diagnosis Date  . Gestational diabetes 06/14/2008  . Hypertension 06/14/2006   Past Surgical History:  Procedure Laterality Date  . CESAREAN SECTION     History reviewed. No pertinent family history. Social History  Substance Use Topics  . Smoking status: Never Smoker  . Smokeless tobacco: Never Used  . Alcohol use No   OB History    No data available     Review of Systems  Respiratory: Negative for shortness of breath.   Skin: Positive for rash.  All other systems reviewed and are negative.   Allergies  Patient has no known allergies.  Home Medications   Prior to Admission medications   Medication Sig Start Date End Date Taking? Authorizing Provider  amLODipine (NORVASC) 2.5 MG tablet Take 1 tablet (2.5 mg total) by mouth daily. 08/31/16   Dessa PhiFunches, Josalyn, MD  predniSONE (DELTASONE) 10 MG tablet 6,5,4,3,2,1 taper 11/05/16   Elson AreasSofia, Leslie K, PA-C   Meds Ordered and Administered this Visit  Medications - No data to display  BP 131/68 (BP Location: Right Arm)   Pulse 79   Temp 99.1 F (37.3 C) (Oral)   Resp 18   LMP 10/17/2016   SpO2 99%  No data found.   Physical Exam  Constitutional: She appears well-developed and well-nourished. No distress.  HENT:  Head: Normocephalic  and atraumatic.  Eyes: Conjunctivae are normal.  Neck: Neck supple.  Cardiovascular: Normal rate and regular rhythm.   No murmur heard. Pulmonary/Chest: Effort normal and breath sounds normal. No respiratory distress.  Abdominal: There is no tenderness.  Musculoskeletal: She exhibits no edema.  Neurological: She is alert.  Skin: There is erythema.  Red raised rash chest and anterior neck.   Psychiatric: She has a normal mood and affect.  Nursing note and vitals reviewed.   Urgent Care Course     Procedures (including critical care time)  Labs Review Labs Reviewed - No data to display  Imaging Review No results found.   Visual Acuity Review  Right Eye Distance:   Left Eye Distance:   Bilateral Distance:    Right Eye Near:   Left Eye Near:    Bilateral Near:         MDM   1. Rash   2. Contact dermatitis, unspecified contact dermatitis type, unspecified trigger    Meds ordered this encounter  Medications  . predniSONE (DELTASONE) 10 MG tablet    Sig: 6,5,4,3,2,1 taper    Dispense:  21 tablet    Refill:  0    Order Specific Question:   Supervising Provider    Answer:   Eustace MooreMURRAY, LAURA W [[id number]]   An After Visit Summary was printed and given to the patient.    Elson AreasSofia, Leslie K, [place]-C 11/05/16 1306

## 2016-11-11 ENCOUNTER — Encounter: Payer: Self-pay | Admitting: Family Medicine

## 2016-11-11 ENCOUNTER — Ambulatory Visit: Payer: Self-pay | Attending: Family Medicine | Admitting: Family Medicine

## 2016-11-11 VITALS — BP 131/79 | HR 83 | Temp 97.9°F | Wt 206.6 lb

## 2016-11-11 DIAGNOSIS — R87619 Unspecified abnormal cytological findings in specimens from cervix uteri: Secondary | ICD-10-CM

## 2016-11-11 DIAGNOSIS — N920 Excessive and frequent menstruation with regular cycle: Secondary | ICD-10-CM | POA: Insufficient documentation

## 2016-11-11 DIAGNOSIS — R87610 Atypical squamous cells of undetermined significance on cytologic smear of cervix (ASC-US): Secondary | ICD-10-CM | POA: Insufficient documentation

## 2016-11-11 DIAGNOSIS — R7303 Prediabetes: Secondary | ICD-10-CM | POA: Insufficient documentation

## 2016-11-11 DIAGNOSIS — I10 Essential (primary) hypertension: Secondary | ICD-10-CM | POA: Insufficient documentation

## 2016-11-11 DIAGNOSIS — Z124 Encounter for screening for malignant neoplasm of cervix: Secondary | ICD-10-CM | POA: Insufficient documentation

## 2016-11-11 LAB — POCT URINE PREGNANCY: Preg Test, Ur: NEGATIVE

## 2016-11-11 NOTE — Patient Instructions (Addendum)
Kristen George was seen today for menstrual problem.  Diagnoses and all orders for this visit:  Menorrhagia with regular cycle -     US Pelvis Complete; Future -     US Transvaginal Non-OB; Future -     POCT urine pregnancy -     Cancel: Ambulatory referral to Gynecology -     Ambulatory referral to Gynecology  Pap smear for cervical cancer screening -     Cytology - PAP    This is the note from the referral coordinator regarding your dental referral Sent  Referral to [facility] ph. # [phone] [address] [place], [place] [id number] They will contact the patient to schedule an appointment. I don't know how long it will take.  Please call the number above to inquire about your position on the list. It may take several months for a non urgent dental appointment.   F/u in 8 weeks for menorrhagia  Dr. Armen PickupFunches    Menorrhagia Menorrhagia is a condition in which menstrual periods are heavy or last longer than normal. With menorrhagia, most periods a woman has may cause enough blood loss and cramping that she becomes unable to take part in her usual activities. What are the causes? Common causes of this condition include:  Noncancerous growths in the uterus (polyps or fibroids).  An imbalance of the estrogen and progesterone hormones.  One of the ovaries not releasing an egg during one or more months.  A problem with the thyroid gland (hypothyroid).  Side effects of having an intrauterine device (IUD).  Side effects of some medicines, such as anti-inflammatory medicines or blood thinners.  A bleeding disorder that stops the blood from clotting normally.  In some cases, the cause of this condition is not known. What are the signs or symptoms? Symptoms of this condition include:  Routinely having to change your pad or tampon every 1-2 hours because it is completely soaked.  Needing to use pads and tampons at the same time because of heavy bleeding.  Needing to  wake up to change your pads or tampons during the night.  Passing blood clots larger than 1 inch (2.5 cm) in size.  Having bleeding that lasts for more than 7 days.  Having symptoms of low iron levels (anemia), such as tiredness, fatigue, or shortness of breath.  How is this diagnosed? This condition may be diagnosed based on:  A physical exam.  Your symptoms and menstrual history.  Tests, such as: ? Blood tests to check if you are pregnant or have hormonal changes, a bleeding or thyroid disorder, anemia, or other problems. ? Pap test to check for cancerous changes, infections, or inflammation. ? Endometrial biopsy. This test involves removing a tissue sample from the lining of the uterus (endometrium) to be examined under a microscope. ? Pelvic ultrasound. This test uses sound waves to create images of your uterus, ovaries, and vagina. The images can show if you have fibroids or other growths. ? Hysteroscopy. For this test, a small telescope is used to look inside your uterus.  How is this treated? Treatment may not be needed for this condition. If it is needed, the best treatment for you will depend on:  Whether you need to prevent pregnancy.  Your desire to have children in the future.  The cause and severity of your bleeding.  Your personal preference.  Medicines are the first step in treatment. You may be treated with:  Hormonal birth control methods. These treatments reduce  bleeding during your menstrual period. They include: ? Birth control pills. ? Skin patch. ? Vaginal ring. ? Shots (injections) that you get every 3 months. ? Hormonal IUD (intrauterine device). ? Implants that go under the skin.  Medicines that thicken blood and slow bleeding.  Medicines that reduce swelling, such as ibuprofen.  Medicines that contain an artificial (synthetic) hormone called progestin.  Medicines that make the ovaries stop working for a short time.  Iron supplements to  treat anemia.  If medicines do not work, surgery may be done. Surgical options may include:  Dilation and curettage (D&C). In this procedure, your health care provider opens (dilates) your cervix and then scrapes or suctions tissue from the endometrium to reduce menstrual bleeding.  Operative hysteroscopy. In this procedure, a small tube with a light on the end (hysteroscope) is used to view your uterus and help remove polyps that may be causing heavy periods.  Endometrial ablation. This is when various techniques are used to permanently destroy your entire endometrium. After endometrial ablation, most women have little or no menstrual flow. This procedure reduces your ability to become pregnant.  Endometrial resection. In this procedure, an electrosurgical wire loop is used to remove the endometrium. This procedure reduces your ability to become pregnant.  Hysterectomy. This is surgical removal of the uterus. This is a permanent procedure that stops menstrual periods. Pregnancy is not possible after a hysterectomy.  Follow these instructions at home: Medicines  Take over-the-counter and prescription medicines exactly as told by your health care provider. This includes iron pills.  Do not change or switch medicines without asking your health care provider.  Do not take aspirin or medicines that contain aspirin 1 week before or during your menstrual period. Aspirin may make bleeding worse. General instructions  If you need to change your sanitary pad or tampon more than once every 2 hours, limit your activity until the bleeding stops.  Iron pills can cause constipation. To prevent or treat constipation while you are taking prescription iron supplements, your health care provider may recommend that you: ? Drink enough fluid to keep your urine clear or pale yellow. ? Take over-the-counter or prescription medicines. ? Eat foods that are high in fiber, such as fresh fruits and vegetables,  whole grains, and beans. ? Limit foods that are high in fat and processed sugars, such as fried and sweet foods.  Eat well-balanced meals, including foods that are high in iron. Foods that have a lot of iron include leafy green vegetables, meat, liver, eggs, and whole grain breads and cereals.  Do not try to lose weight until the abnormal bleeding has stopped and your blood iron level is back to normal. If you need to lose weight, work with your health care provider to lose weight safely.  Keep all follow-up visits as told by your health care provider. This is important. Contact a health care provider if:  You soak through a pad or tampon every 1 or 2 hours, and this happens every time you have a period.  You need to use pads and tampons at the same time because you are bleeding so much.  You have nausea, vomiting, diarrhea, or other problems related to medicines you are taking. Get help right away if:  You soak through more than a pad or tampon in 1 hour.  You pass clots bigger than 1 inch (2.5 cm) wide.  You feel short of breath.  You feel like your heart is beating too fast.  You feel dizzy or faint.  You feel very weak or tired. Summary  Menorrhagia is a condition in which menstrual periods are heavy or last longer than normal.  Treatment will depend on the cause of the condition and may include medicines or procedures.  Take over-the-counter and prescription medicines exactly as told by your health care provider. This includes iron pills.  Get help right away if you have heavy bleeding that soaks through more than a pad or tampon in 1 hour, you are passing large clots, or you feel dizzy, faint or short of breath. This information is not intended to replace advice given to you by your health care provider. Make sure you discuss any questions you have with your health care provider. Document Released: 05/31/2005 Document Revised: 05/24/2016 Document Reviewed:  05/24/2016 Elsevier Interactive Patient Education  2017 [employer].

## 2016-11-11 NOTE — Progress Notes (Signed)
Subjective:  Patient ID: Kristen George, female    DOB: 01-31-1966  Age: 51 y.o. MRN: [id number]  CC: Menstrual Problem   HPI Kristen George has pre diabetes and HTN presents for   1. Heavy menstrual bleeding: for the past year. She has regular monthly period. The periods last 7-10 days which is a change from her usual 3-4 days. She denies dizziness, lightheadedness and fainting. She denies pelvic cramping and low back pain. She is sexually active with her [place] who [place] of the [place]. She last had sex 8 months ago. She has two children (age 38 and 8010) both conceived by IVF. 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 Both were delivered via C-section.   Past Surgical History:  Procedure Laterality Date  . CESAREAN SECTION     Social History  Substance Use Topics  . Smoking status: Never Smoker  . Smokeless tobacco: Never Used  . Alcohol use No    Outpatient Medications Prior to Visit  Medication Sig Dispense Refill  . amLODipine (NORVASC) 2.5 MG tablet Take 1 tablet (2.5 mg total) by mouth daily. 30 tablet 5  . predniSONE (DELTASONE) 10 MG tablet 6,5,4,3,2,1 taper 21 tablet 0   No facility-administered medications prior to visit.     ROS Review of Systems  Constitutional: Negative for chills and fever.  Eyes: Negative for visual disturbance.  Respiratory: Negative for shortness of breath.   Cardiovascular: Negative for chest pain.  Gastrointestinal: Negative for abdominal pain and blood in stool.  Genitourinary: Positive for menstrual problem and vaginal bleeding.  Musculoskeletal: Negative for arthralgias and back pain.  Skin: Negative for rash.  Allergic/Immunologic: Negative for immunocompromised state.  Hematological: Negative for adenopathy. Does not bruise/bleed easily.  Psychiatric/Behavioral: Negative for dysphoric mood and suicidal ideas.    Objective:  BP 131/79   Pulse 83   Temp 97.9 F (36.6 C) (Oral)   Wt 206 lb 9.6 oz  (93.7 kg)   LMP 10/17/2016   SpO2 98%   BMI 30.51 kg/m   BP/Weight 11/11/2016 11/05/2016 08/31/2016  Systolic BP 131 131 145  Diastolic BP 79 68 83  Wt. (Lbs) 206.6 - 204.8  BMI 30.51 - 30.24    Physical Exam  Constitutional: She appears well-developed and well-nourished. No distress.  Cardiovascular: Normal rate, regular rhythm, normal heart sounds and intact distal pulses.   Pulmonary/Chest: Effort normal and breath sounds normal.  Genitourinary: Vagina normal. Pelvic exam was performed with patient prone. There is no rash, tenderness or lesion on the right labia. There is no rash, tenderness or lesion on the left labia. Uterus is enlarged (uterus is slightly enlarged ). Uterus is not tender. Cervix exhibits no motion tenderness, no discharge and no friability.    Musculoskeletal: She exhibits no edema.  Lymphadenopathy:       Right: No inguinal adenopathy present.       Left: No inguinal adenopathy present.  Skin: Skin is warm and dry. No rash noted.   U preg: negative   Assessment & Plan:  [name] was seen today for menstrual problem.  Diagnoses and all orders for this visit:  Menorrhagia with regular cycle -     US Pelvis Complete; Future -     US Transvaginal Non-OB; Future -     POCT urine pregnancy -     Cancel: Ambulatory referral to Gynecology -     Ambulatory referral to Gynecology -     CBC  Pap smear for cervical cancer  screening -     Cytology - PAP   There are no diagnoses linked to this encounter.  No orders of the defined types were placed in this encounter.   Follow-up: Return in about 2 months (around 01/11/2017) for menorrhagia.   Dessa Phi MD

## 2016-11-12 ENCOUNTER — Encounter: Payer: Self-pay | Admitting: Obstetrics & Gynecology

## 2016-11-12 LAB — CBC
Hematocrit: 35 % (ref 34.0–46.6)
Hemoglobin: 11.2 g/dL (ref 11.1–15.9)
MCH: 27.5 pg (ref 26.6–33.0)
MCHC: 32 g/dL (ref 31.5–35.7)
MCV: 86 fL (ref 79–97)
PLATELETS: 221 10*3/uL (ref 150–379)
RBC: 4.07 x10E6/uL (ref 3.77–5.28)
RDW: 14.7 % (ref 12.3–15.4)
WBC: 5.8 10*3/uL (ref 3.4–10.8)

## 2016-11-12 LAB — CERVICOVAGINAL ANCILLARY ONLY: Wet Prep (BD Affirm): NEGATIVE

## 2016-11-14 LAB — CYTOLOGY - PAP
CHLAMYDIA, DNA PROBE: NEGATIVE
HPV (WINDOPATH): NOT DETECTED
NEISSERIA GONORRHEA: NEGATIVE

## 2016-11-17 ENCOUNTER — Telehealth: Payer: Self-pay

## 2016-11-17 NOTE — Telephone Encounter (Signed)
Pt was called and informed of lab results. 

## 2016-11-18 DIAGNOSIS — R87619 Unspecified abnormal cytological findings in specimens from cervix uteri: Secondary | ICD-10-CM | POA: Insufficient documentation

## 2016-11-19 ENCOUNTER — Ambulatory Visit (HOSPITAL_COMMUNITY): Admission: RE | Admit: 2016-11-19 | Payer: Self-pay | Source: Ambulatory Visit

## 2016-12-06 ENCOUNTER — Telehealth: Payer: Self-pay

## 2016-12-06 NOTE — Telephone Encounter (Signed)
Pt does not have correct number on file.

## 2016-12-09 ENCOUNTER — Encounter: Payer: Self-pay | Admitting: General Practice

## 2016-12-09 ENCOUNTER — Encounter: Payer: Self-pay | Admitting: Obstetrics & Gynecology

## 2017-02-16 ENCOUNTER — Ambulatory Visit: Payer: Self-pay

## 2017-02-25 ENCOUNTER — Ambulatory Visit: Payer: Self-pay

## 2017-03-04 ENCOUNTER — Ambulatory Visit: Payer: Self-pay | Attending: Internal Medicine

## 2017-03-07 ENCOUNTER — Ambulatory Visit: Payer: Self-pay

## 2017-03-08 ENCOUNTER — Ambulatory Visit: Payer: Self-pay

## 2017-03-23 ENCOUNTER — Ambulatory Visit: Payer: Self-pay | Attending: Family Medicine | Admitting: Family Medicine

## 2017-03-23 ENCOUNTER — Encounter: Payer: Self-pay | Admitting: Family Medicine

## 2017-03-23 VITALS — BP 157/90 | HR 88 | Temp 98.0°F | Resp 18 | Ht 70.0 in | Wt 215.4 lb

## 2017-03-23 DIAGNOSIS — Z1239 Encounter for other screening for malignant neoplasm of breast: Secondary | ICD-10-CM

## 2017-03-23 DIAGNOSIS — I10 Essential (primary) hypertension: Secondary | ICD-10-CM | POA: Insufficient documentation

## 2017-03-23 DIAGNOSIS — Z23 Encounter for immunization: Secondary | ICD-10-CM | POA: Insufficient documentation

## 2017-03-23 DIAGNOSIS — Z7952 Long term (current) use of systemic steroids: Secondary | ICD-10-CM | POA: Insufficient documentation

## 2017-03-23 DIAGNOSIS — R7303 Prediabetes: Secondary | ICD-10-CM | POA: Insufficient documentation

## 2017-03-23 DIAGNOSIS — Z889 Allergy status to unspecified drugs, medicaments and biological substances status: Secondary | ICD-10-CM

## 2017-03-23 DIAGNOSIS — L239 Allergic contact dermatitis, unspecified cause: Secondary | ICD-10-CM | POA: Insufficient documentation

## 2017-03-23 DIAGNOSIS — Z79899 Other long term (current) drug therapy: Secondary | ICD-10-CM | POA: Insufficient documentation

## 2017-03-23 DIAGNOSIS — Z1231 Encounter for screening mammogram for malignant neoplasm of breast: Secondary | ICD-10-CM

## 2017-03-23 LAB — GLUCOSE, POCT (MANUAL RESULT ENTRY): POC GLUCOSE: 121 mg/dL — AB (ref 70–99)

## 2017-03-23 LAB — POCT GLYCOSYLATED HEMOGLOBIN (HGB A1C): Hemoglobin A1C: 6.3

## 2017-03-23 MED ORDER — AMLODIPINE BESYLATE 2.5 MG PO TABS: 2.5000 mg | ORAL_TABLET | Freq: Every day | ORAL | 3 refills | Status: DC

## 2017-03-23 MED ORDER — METFORMIN HCL 500 MG PO TABS: 500.0000 mg | ORAL_TABLET | Freq: Every day | ORAL | 3 refills | Status: DC

## 2017-03-23 MED ORDER — LORATADINE 10 MG PO TABS: 10.0000 mg | ORAL_TABLET | Freq: Every day | ORAL | 11 refills | Status: DC

## 2017-03-23 MED ORDER — EPINEPHRINE 0.3 MG/0.3ML IJ SOAJ: 0.3000 mg | Freq: Once | INTRAMUSCULAR | 1 refills | Status: DC | PRN

## 2017-03-23 MED ORDER — TRIAMCINOLONE ACETONIDE 0.1 % EX CREA: 1.0000 "application " | TOPICAL_CREAM | Freq: Two times a day (BID) | CUTANEOUS | 0 refills | Status: DC | PRN

## 2017-03-23 MED FILL — ?METFORMIN HCL 500MG TABLET: 500 | 30 days supply | Qty: 30 | Fill #0

## 2017-03-23 MED FILL — AMLODIPINE BESYLATE 2.5 MG: 2.5 | 30 days supply | Qty: 30 | Fill #0

## 2017-03-23 MED FILL — ?TRIAMCINOLONE 0.1% CRM: 0.1 | 30 days supply | Qty: 30 | Fill #0

## 2017-03-23 NOTE — Patient Instructions (Signed)
Contact Dermatitis Dermatitis is redness, soreness, and swelling (inflammation) of the skin. Contact dermatitis is a reaction to certain substances that touch the skin. You either touched something that irritated your skin, or you have allergies to something you touched. Follow these instructions at home: Skin Care  Moisturize your skin as needed.  Apply cool compresses to the affected areas.  Try taking a bath with: ? Epsom salts. Follow the instructions on the package. You can get these at a pharmacy or grocery store. ? Baking soda. Pour a small amount into the bath as told by your doctor. ? Colloidal oatmeal. Follow the instructions on the package. You can get this at a pharmacy or grocery store.  Try applying baking soda paste to your skin. Stir water into baking soda until it looks like paste.  Do not scratch your skin.  Bathe less often.  Bathe in lukewarm water. Avoid using hot water. Medicines  Take or apply over-the-counter and prescription medicines only as told by your doctor.  If you were prescribed an antibiotic medicine, take or apply your antibiotic as told by your doctor. Do not stop taking the antibiotic even if your condition starts to get better. General instructions  Keep all follow-up visits as told by your doctor. This is important.  Avoid the substance that caused your reaction. If you do not know what caused it, keep a journal to try to track what caused it. Write down: ? What you eat. ? What cosmetic products you use. ? What you drink. ? What you wear in the affected area. This includes jewelry.  If you were given a bandage (dressing), take care of it as told by your doctor. This includes when to change and remove it. Contact a doctor if:  You do not get better with treatment.  Your condition gets worse.  You have signs of infection such as: ? Swelling. ? Tenderness. ? Redness. ? Soreness. ? Warmth.  You have a fever.  You have new  symptoms. Get help right away if:  You have a very bad headache.  You have neck pain.  Your neck is stiff.  You throw up (vomit).  You feel very sleepy.  You see red streaks coming from the affected area.  Your bone or joint underneath the affected area becomes painful after the skin has healed.  The affected area turns darker.  You have trouble breathing. This information is not intended to replace advice given to you by your health care provider. Make sure you discuss any questions you have with your health care provider. Document Released: 03/28/2009 Document Revised: 11/06/2015 Document Reviewed: 10/16/2014 Elsevier Interactive Patient Education  2018 [employer]  

## 2017-03-23 NOTE — Progress Notes (Signed)
Subjective:  Patient ID: Kristen George, female    DOB: 08-09-1965  Age: 51 y.o. MRN: [id number]  CC: Establish Care   HPI Kristen George presents to reestablish care. PMH of hypertension. She complains of a rash. Onset 4 months ago reports being treated previously with oral medication with minimal relief of symptoms. Associated symptoms include puritis and redness. She reports rash is recurring and is located around the neck. Rash has not changed over time Patient has not had contacts with similar rash. Patient has not identified precipitant. Patient has not had new exposures (soaps, lotions, laundry detergents, foods, medications, plants, insects or animals.) She reports discontinuing shrimp consumption 2 years ago due to symptoms of pruritis and  facial swelling. She reports recently discontinuing peanut consumption after noticing bumps appearing on her face after consumption.  History of  Hypertension. She is not exercising and is not adherent to low salt diet.  She does not check BP at home. She reports being without her blood pressure medications for 2 months. Cardiac symptoms none. Patient denies chest pain, claudication, dyspnea, lower extremity edema, near-syncope, palpitations and syncope.  Cardiovascular risk factors: hypertension and sedentary lifestyle. Use of agents associated with hypertension: none. History of target organ damage: none.    Outpatient Medications Prior to Visit  Medication Sig Dispense Refill  . predniSONE (DELTASONE) 10 MG tablet 6,5,4,3,2,1 taper (Patient not taking: Reported on 03/23/2017) 21 tablet 0  . amLODipine (NORVASC) 2.5 MG tablet Take 1 tablet (2.5 mg total) by mouth daily. (Patient not taking: Reported on 03/23/2017) 30 tablet 5   No facility-administered medications prior to visit.     ROS Review of Systems  Constitutional: Negative.   Respiratory: Negative.   Cardiovascular: Negative.   Gastrointestinal: Negative.   Skin: Positive for rash.     Objective:  BP (!) 157/90 (BP Location: Left Arm, Patient Position: Sitting, Cuff Size: Normal)   Pulse 88   Temp 98 F (36.7 C) (Oral)   Resp 18   Ht  (1.778 m)   Wt 215 lb 6.4 oz (97.7 kg)   SpO2 98%   BMI 30.91 kg/m   BP/Weight 03/23/2017 11/11/2016 11/05/2016  Systolic BP 157 131 131  Diastolic BP 90 79 68  Wt. (Lbs) 215.4 206.6 -  BMI 30.91 30.51 -     Physical Exam  Constitutional: She appears well-developed and well-nourished.  Eyes: Pupils are equal, round, and reactive to light. Conjunctivae are normal.  Neck: No JVD present.  Cardiovascular: Normal rate, regular rhythm, normal heart sounds and intact distal pulses.   Pulmonary/Chest: Effort normal and breath sounds normal.  Abdominal: Soft. Bowel sounds are normal. There is no tenderness.  Skin: Skin is warm and dry. Rash noted. Rash is macular (No redness; areas of hyperpigmentation scarring.).  Nursing note and vitals reviewed.    Assessment & Plan:   1. Allergic contact dermatitis, unspecified trigger  - triamcinolone cream (KENALOG) 0.1 %; Apply 1 application topically 2 (two) times daily as needed. Apply to affected areas.  Dispense: 30 g; Refill: 0 - loratadine (CLARITIN) 10 MG tablet; Take 1 tablet (10 mg total) by mouth daily.  Dispense: 30 tablet; Refill: 11 - Ambulatory referral to Allergy  2. Prediabetes  Encouraged to incorporate dietary and lifestyle changes. - Glucose (CBG) - HgB A1c - metFORMIN (GLUCOPHAGE) 500 MG tablet; Take 1 tablet (500 mg total) by mouth daily with breakfast.  Dispense: 30 tablet; Refill: 3 - Lipid Panel; Future - CMP and Liver; Future  3. Essential hypertension  Schedule BP recheck in 3 weeks with nurse. If BP is greater than 90/60 (MAP 65 or greater) but not less than 130/80 may increase dose of amlodipine to 5 mg QD and recheck in 2 weeks.  - amLODipine (NORVASC) 2.5 MG tablet; Take 1 tablet (2.5 mg total) by mouth daily.  Dispense: 30 tablet; Refill:  3 - Lipid Panel; Future - CMP and Liver; Future - Microalbumin/Creatinine Ratio, Urine; Future  4. Allergic reaction, history of  - loratadine (CLARITIN) 10 MG tablet; Take 1 tablet (10 mg total) by mouth daily.  Dispense: 30 tablet; Refill: 11 - EPINEPHrine 0.3 mg/0.3 mL IJ SOAJ injection; Inject 0.3 mLs (0.3 mg total) into the muscle once as needed. May repeat dose once after 5 to 15 minutes.  Dispense: 2 Device; Refill: 1 - Ambulatory referral to Allergy  5. Needs flu shot Patient has to leave, requests vaccination when she comes back for labs. - Flu Vaccine QUAD 6+ mos PF IM (Fluarix Quad PF)  6. Screening for breast cancer  - MM SCREENING BREAST TOMO BILATERAL; Future   Meds ordered this encounter  Medications  . amLODipine (NORVASC) 2.5 MG tablet    Sig: Take 1 tablet (2.5 mg total) by mouth daily.    Dispense:  30 tablet    Refill:  3    Order Specific Question:   Supervising Provider    Answer:   Quentin Angst [id number]  . triamcinolone cream (KENALOG) 0.1 %    Sig: Apply 1 application topically 2 (two) times daily as needed. Apply to affected areas.    Dispense:  30 g    Refill:  0    Order Specific Question:   Supervising Provider    Answer:   Quentin Angst [id number]  . loratadine (CLARITIN) 10 MG tablet    Sig: Take 1 tablet (10 mg total) by mouth daily.    Dispense:  30 tablet    Refill:  11    Order Specific Question:   Supervising Provider    Answer:   Quentin Angst [id number]  . EPINEPHrine 0.3 mg/0.3 mL IJ SOAJ injection    Sig: Inject 0.3 mLs (0.3 mg total) into the muscle once as needed. May repeat dose once after 5 to 15 minutes.    Dispense:  2 Device    Refill:  1    Order Specific Question:   Supervising Provider    Answer:   Quentin Angst [id number]  . metFORMIN (GLUCOPHAGE) 500 MG tablet    Sig: Take 1 tablet (500 mg total) by mouth daily with breakfast.    Dispense:  30 tablet    Refill:  3    Order Specific  Question:   Supervising Provider    Answer:   Quentin Angst [[id number]]    Follow-up: Return in about 3 weeks (around 04/13/2017) for BP with [facility].   Kristen Bark FNP

## 2017-03-23 NOTE — Progress Notes (Signed)
Patient is here for allergies itchy rashes around neck   Patient been without BP med for 2 months

## 2017-04-13 ENCOUNTER — Ambulatory Visit: Payer: Self-pay | Attending: Family Medicine | Admitting: *Deleted

## 2017-04-13 VITALS — BP 126/74 | HR 83

## 2017-04-13 DIAGNOSIS — I10 Essential (primary) hypertension: Secondary | ICD-10-CM | POA: Insufficient documentation

## 2017-04-13 NOTE — Patient Instructions (Signed)
Patient advised to continue with current medications and to follow up with PCP for routine visit.

## 2017-04-21 ENCOUNTER — Ambulatory Visit
Admission: RE | Admit: 2017-04-21 | Discharge: 2017-04-21 | Disposition: A | Discharge status: Home / Self Care | Payer: Medicaid Other | Source: Ambulatory Visit | Attending: Family Medicine | Admitting: Family Medicine

## 2017-04-21 DIAGNOSIS — Z1239 Encounter for other screening for malignant neoplasm of breast: Secondary | ICD-10-CM

## 2017-05-09 MED FILL — AMLODIPINE BESYLATE 2.5 MG: 2.5 | 30 days supply | Qty: 30 | Fill #1

## 2017-05-23 ENCOUNTER — Ambulatory Visit: Payer: Medicaid Other | Admitting: Allergy and Immunology

## 2017-07-04 ENCOUNTER — Encounter: Payer: Self-pay | Admitting: Allergy and Immunology

## 2017-07-04 ENCOUNTER — Telehealth: Payer: Self-pay | Admitting: Allergy and Immunology

## 2017-07-04 ENCOUNTER — Ambulatory Visit (INDEPENDENT_AMBULATORY_CARE_PROVIDER_SITE_OTHER): Payer: Self-pay | Admitting: Allergy and Immunology

## 2017-07-04 VITALS — BP 132/90 | HR 79 | Resp 16 | Ht 70.0 in | Wt 215.6 lb

## 2017-07-04 DIAGNOSIS — L500 Allergic urticaria: Secondary | ICD-10-CM | POA: Insufficient documentation

## 2017-07-04 DIAGNOSIS — J3089 Other allergic rhinitis: Secondary | ICD-10-CM

## 2017-07-04 DIAGNOSIS — T7800XA Anaphylactic reaction due to unspecified food, initial encounter: Secondary | ICD-10-CM | POA: Insufficient documentation

## 2017-07-04 DIAGNOSIS — T7800XD Anaphylactic reaction due to unspecified food, subsequent encounter: Secondary | ICD-10-CM

## 2017-07-04 MED ORDER — EPINEPHRINE 0.3 MG/0.3ML IJ SOAJ: 0.3000 mg | Freq: Once | INTRAMUSCULAR | 2 refills | Status: AC

## 2017-07-04 MED ORDER — MOMETASONE FUROATE 50 MCG/ACT NA SUSP: 2.0000 | Freq: Every day | NASAL | 5 refills | Status: DC

## 2017-07-04 MED ORDER — LEVOCETIRIZINE DIHYDROCHLORIDE 5 MG PO TABS: 5.0000 mg | ORAL_TABLET | Freq: Every evening | ORAL | 5 refills | Status: DC

## 2017-07-04 NOTE — Assessment & Plan Note (Signed)
   Aeroallergen avoidance measures have been discussed and provided in written form.  Levocetirizine has been prescribed (as above).  A prescription has been provided for Nasonex nasal spray, one spray per nostril 1-2 times daily as needed. Proper nasal spray technique has been discussed and demonstrated.  Nasal saline spray (i.e. Simply Saline) is recommended prior to medicated nasal sprays and as needed.

## 2017-07-04 NOTE — Patient Instructions (Addendum)
Food allergy The patient's history suggests shellfish allergy and positive skin test results today confirm this diagnosis.  Meticulous avoidance of shellfish as discussed.  A prescription has been provided for epinephrine auto-injector 2 pack along with instructions for proper administration.  A food allergy action plan has been provided and discussed.  Medic Alert identification is recommended.  Urticaria Unclear etiology. Skin tests to select food allergens were negative today with the exception of shrimp and crab, however she was not consuming shellfish during the time that she had the rash.. NSAIDs and emotional stress commonly exacerbate urticaria but are not the underlying etiology in this case. Physical urticarias are negative by history (i.e. pressure-induced, temperature, vibration, solar, etc.). There are no concomitant symptoms concerning for anaphylaxis or constitutional symptoms worrisome for an underlying malignancy.   We will not order labs at this time, however, if lesions recur, persist, progress, or change in character, we will assess potential etiologies with screening labs.  For symptom relief, patient is to take oral antihistamines as directed.  A prescription has been provided for levocetirizine, 5 mg daily as needed.  Should symptoms recur, a journal is to be kept recording any foods eaten, beverages consumed, medications taken within a 6 hour period prior to the onset of symptoms, as well as record activities being performed, and environmental conditions. For any symptoms concerning for anaphylaxis, epinephrine is to be administered and 911 is to be called immediately.  Perennial and seasonal allergic rhinitis  Aeroallergen avoidance measures have been discussed and provided in written form.  Levocetirizine has been prescribed (as above).  A prescription has been provided for Nasonex nasal spray, one spray per nostril 1-2 times daily as needed. Proper nasal spray  technique has been discussed and demonstrated.  Nasal saline spray (i.e. Simply Saline) is recommended prior to medicated nasal sprays and as needed.   Return in about 5 months (around 12/02/2017), or if symptoms worsen or fail to improve.  Control of House Dust Mite Allergen  House dust mites play a major role in allergic asthma and rhinitis.  They occur in environments with high humidity wherever human skin, the food for dust mites is found. High levels have been detected in dust obtained from mattresses, pillows, carpets, upholstered furniture, bed covers, clothes and soft toys.  The principal allergen of the house dust mite is found in its feces.  A gram of dust may contain 1,000 mites and 250,000 fecal particles.  Mite antigen is easily measured in the air during house cleaning activities.    1. Encase mattresses, including the box spring, and pillow, in an air tight cover.  Seal the zipper end of the encased mattresses with wide adhesive tape. 2. Wash the bedding in water of 130 degrees Farenheit weekly.  Avoid cotton comforters/quilts and flannel bedding: the most ideal bed covering is the dacron comforter. 3. Remove all upholstered furniture from the bedroom. 4. Remove carpets, carpet padding, rugs, and non-washable window drapes from the bedroom.  Wash drapes weekly or use plastic window coverings. 5. Remove all non-washable stuffed toys from the bedroom.  Wash stuffed toys weekly. 6. Have the room cleaned frequently with a vacuum cleaner and a damp dust-mop.  The patient should not be in a room which is being cleaned and should wait 1 hour after cleaning before going into the room. 7. Close and seal all heating outlets in the bedroom.  Otherwise, the room will become filled with dust-laden air.  An electric heater can be used to  heat the room. 8. Reduce indoor humidity to less than 50%.  Do not use a humidifier.  Reducing Pollen Exposure  The American Academy of Allergy, Asthma and  Immunology suggests the following steps to reduce your exposure to pollen during allergy seasons.    1. Do not hang sheets or clothing out to dry; pollen may collect on these items. 2. Do not mow lawns or spend time around freshly cut grass; mowing stirs up pollen. 3. Keep windows closed at night.  Keep car windows closed while driving. 4. Minimize morning activities outdoors, a time when pollen counts are usually at their highest. 5. Stay indoors as much as possible when pollen counts or humidity is high and on windy days when pollen tends to remain in the air longer. 6. Use air conditioning when possible.  Many air conditioners have filters that trap the pollen spores. 7. Use a HEPA room air filter to remove pollen form the indoor air you breathe.   Control of Mold Allergen  Mold and fungi can grow on a variety of surfaces provided certain temperature and moisture conditions exist.  Outdoor molds grow on plants, decaying vegetation and soil.  The major outdoor mold, Alternaria and Cladosporium, are found in very high numbers during hot and dry conditions.  Generally, a late Summer - Fall peak is seen for common outdoor fungal spores.  Rain will temporarily lower outdoor mold spore count, but counts rise rapidly when the rainy period ends.  The most important indoor molds are Aspergillus and Penicillium.  Dark, humid and poorly ventilated basements are ideal sites for mold growth.  The next most common sites of mold growth are the bathroom and the kitchen.  Outdoor [employer] 1. Use air conditioning and keep windows closed 2. Avoid exposure to decaying vegetation. 3. Avoid leaf raking. 4. Avoid grain handling. 5. Consider wearing a face mask if working in moldy areas.  Indoor Mold Control 1. Maintain humidity below 50%. 2. Clean washable surfaces with 5% bleach solution. 3. Remove sources e.g. Contaminated carpets.

## 2017-07-04 NOTE — Assessment & Plan Note (Signed)
The patient's history suggests shellfish allergy and positive skin test results today confirm this diagnosis.  Meticulous avoidance of shellfish as discussed.  A prescription has been provided for epinephrine auto-injector 2 pack along with instructions for proper administration.  A food allergy action plan has been provided and discussed.  Medic Alert identification is recommended. 

## 2017-07-04 NOTE — Assessment & Plan Note (Signed)
Unclear etiology. Skin tests to select food allergens were negative today with the exception of shrimp and crab, however she was not consuming shellfish during the time that she had the rash.. NSAIDs and emotional stress commonly exacerbate urticaria but are not the underlying etiology in this case. Physical urticarias are negative by history (i.e. pressure-induced, temperature, vibration, solar, etc.). There are no concomitant symptoms concerning for anaphylaxis or constitutional symptoms worrisome for an underlying malignancy.   We will not order labs at this time, however, if lesions recur, persist, progress, or change in character, we will assess potential etiologies with screening labs.  For symptom relief, patient is to take oral antihistamines as directed.  A prescription has been provided for levocetirizine, 5 mg daily as needed.  Should symptoms recur, a journal is to be kept recording any foods eaten, beverages consumed, medications taken within a 6 hour period prior to the onset of symptoms, as well as record activities being performed, and environmental conditions. For any symptoms concerning for anaphylaxis, epinephrine is to be administered and 911 is to be called immediately.

## 2017-07-04 NOTE — Telephone Encounter (Signed)
Pt has Financial Assistance  That covers at 100%.

## 2017-07-04 NOTE — Progress Notes (Signed)
New Patient Note  RE: Kristen George MRN: [id number] DOB: 09/30/1965 Date of Office Visit: 07/04/2017  Referring provider: Thomas Hoff* Primary care provider: Dessa Phi, MD  Chief Complaint: Food allergy; Rash; and Urticaria   History of present illness: Kristen George is a 52 y.o. female seen today in consultation requested by Arrie Senate, F-NP.  She reports that approximately 4 years ago while in [place] she consumed shrimp and "immediately" became pruritic and developed generalized urticaria as well as lip swelling.  She denies concomitant cardiopulmonary or GI symptoms.  She was given some medication from a [place] pharmacy and her symptoms resolved over the course of the next 6-12 hours without further medical intervention.  In 2018, she once again consumed shrimp and developed generalized pruritus and urticaria, though not as severe as the first incident.  She did not experience associated cardiopulmonary or GI symptoms.  The symptoms resolved spontaneously without medical intervention. From June - October 2018, she had a rash around her neck and back which would come and go.  She states that the skin would become itchy and when she scratched it raised lesions would appear and over the next day or 2 "turned dark".  She was not eating shrimp at that time.  This problem resolved in November and has not returned since.  She took loratadine as needed without significant benefit.   Assessment and plan: Food allergy The patient's history suggests shellfish allergy and positive skin test results today confirm this diagnosis.  Meticulous avoidance of shellfish as discussed.  A prescription has been provided for epinephrine auto-injector 2 pack along with instructions for proper administration.  A food allergy action plan has been provided and discussed.  Medic Alert identification is recommended.  Urticaria Unclear etiology. Skin tests to select food allergens  were negative today with the exception of shrimp and crab, however she was not consuming shellfish during the time that she had the rash.. NSAIDs and emotional stress commonly exacerbate urticaria but are not the underlying etiology in this case. Physical urticarias are negative by history (i.e. pressure-induced, temperature, vibration, solar, etc.). There are no concomitant symptoms concerning for anaphylaxis or constitutional symptoms worrisome for an underlying malignancy.   We will not order labs at this time, however, if lesions recur, persist, progress, or change in character, we will assess potential etiologies with screening labs.  For symptom relief, patient is to take oral antihistamines as directed.  A prescription has been provided for levocetirizine, 5 mg daily as needed.  Should symptoms recur, a journal is to be kept recording any foods eaten, beverages consumed, medications taken within a 6 hour period prior to the onset of symptoms, as well as record activities being performed, and environmental conditions. For any symptoms concerning for anaphylaxis, epinephrine is to be administered and 911 is to be called immediately.  Perennial and seasonal allergic rhinitis  Aeroallergen avoidance measures have been discussed and provided in written form.  Levocetirizine has been prescribed (as above).  A prescription has been provided for Nasonex nasal spray, one spray per nostril 1-2 times daily as needed. Proper nasal spray technique has been discussed and demonstrated.  Nasal saline spray (i.e. Simply Saline) is recommended prior to medicated nasal sprays and as needed.   Meds ordered this encounter  Medications  . EPINEPHrine (EPIPEN 2-PAK) 0.3 mg/0.3 mL IJ SOAJ injection    Sig: Inject 0.3 mLs (0.3 mg total) into the muscle once for 1 dose.    Dispense:  2 Device    Refill:  2  . mometasone (NASONEX) 50 MCG/ACT nasal spray    Sig: Place 2 sprays into the nose daily. Two sprays  each in each nostril    Dispense:  17 g    Refill:  5  . levocetirizine (XYZAL) 5 MG tablet    Sig: Take 1 tablet (5 mg total) by mouth every evening.    Dispense:  30 tablet    Refill:  5    Diagnostics: Environmental skin testing: Positive to ragweed pollen, mold, and dust mite antigen. Food allergen skin testing: Positive to shrimp and crab.    Physical examination: Blood pressure 132/90, pulse 79, resp. rate 16, height 5\' 10"  (1.778 m), weight 215 lb 9.6 oz (97.8 kg), SpO2 98 %.  General: Alert, interactive, in no acute distress. HEENT: TMs pearly gray, turbinates edematous with clear discharge, post-pharynx moderately erythematous. Neck: Supple without lymphadenopathy. Lungs: Clear to auscultation without wheezing, rhonchi or rales. CV: Normal S1, S2 without murmurs. Abdomen: Nondistended, nontender. Skin: Warm and dry, without lesions or rashes. Extremities:  No clubbing, cyanosis or edema. Neuro:   Grossly intact.  Review of systems:  Review of systems negative except as noted in HPI / PMHx or noted below: ROS  Past medical history:  Past Medical History:  Diagnosis Date  . Gestational diabetes 06/14/2008  . Hypertension 06/14/2006    Past surgical history:  Past Surgical History:  Procedure Laterality Date  . CESAREAN SECTION      Family history: Family History  Problem Relation Age of Onset  . Cancer Maternal Aunt   . Allergic rhinitis Neg Hx   . Angioedema Neg Hx   . Asthma Neg Hx   . Atopy Neg Hx   . Eczema Neg Hx   . Immunodeficiency Neg Hx   . Urticaria Neg Hx     Social history: Social History   Socioeconomic History  . Marital status: Married    Spouse name: Not on file  . Number of children: Not on file  . Years of education: Not on file  . Highest education level: Not on file  Social Needs  . Financial resource strain: Not on file  . Food insecurity - worry: Not on file  . Food insecurity - inability: Not on file  . Transportation  needs - medical: Not on file  . Transportation needs - non-medical: Not on file  Occupational History  . Not on file  Tobacco Use  . Smoking status: Never Smoker  . Smokeless tobacco: Never Used  Substance and Sexual Activity  . Alcohol use: No    Alcohol/week: 0.0 oz  . Drug use: No  . Sexual activity: Not on file  Other Topics Concern  . Not on file  Social History Narrative  . Not on file   Environmental History: The patient lives in an apartment with carpeting throughout and central air/heat.  She is a non-smoker without pets.  There is no known mold/water damage in the apartment.  Allergies as of 07/04/2017      Reactions   Other Itching, Rash   Peanuts   Shrimp [shellfish Allergy] Itching, Swelling      Medication List        Accurate as of 07/04/17  7:26 PM. Always use your most recent med list.          amLODipine 2.5 MG tablet Commonly known as:  NORVASC Take 1 tablet (2.5 mg total) by mouth daily.   EPINEPHrine  0.3 mg/0.3 mL Soaj injection Commonly known as:  EPIPEN 2-PAK Inject 0.3 mLs (0.3 mg total) into the muscle once for 1 dose.   levocetirizine 5 MG tablet Commonly known as:  XYZAL Take 1 tablet (5 mg total) by mouth every evening.   loratadine 10 MG tablet Commonly known as:  CLARITIN Take 1 tablet (10 mg total) by mouth daily.   mometasone 50 MCG/ACT nasal spray Commonly known as:  NASONEX Place 2 sprays into the nose daily. Two sprays each in each nostril   triamcinolone cream 0.1 % Commonly known as:  KENALOG Apply 1 application topically 2 (two) times daily as needed. Apply to affected areas.       Known medication allergies: Allergies  Allergen Reactions  . Other Itching and Rash    Peanuts  . Shrimp [Shellfish Allergy] Itching and Swelling    I appreciate the opportunity to take part in Kristen George's care. Please do not hesitate to contact me with questions.  Sincerely,   R. Jorene Guest, MD

## 2017-07-18 MED FILL — AMLODIPINE BESYLATE 2.5 MG: 2.5 | 30 days supply | Qty: 30 | Fill #2

## 2017-07-21 MED FILL — MOMETASONE FUROATE 50 MCG S: 50 | 30 days supply | Qty: 17 | Fill #0

## 2017-07-26 ENCOUNTER — Ambulatory Visit: Payer: Self-pay | Attending: Family Medicine | Admitting: Family Medicine

## 2017-07-26 ENCOUNTER — Encounter: Payer: Self-pay | Admitting: Family Medicine

## 2017-07-26 VITALS — BP 143/91 | HR 72 | Temp 98.3°F | Resp 16 | Wt 215.0 lb

## 2017-07-26 DIAGNOSIS — Z1211 Encounter for screening for malignant neoplasm of colon: Secondary | ICD-10-CM

## 2017-07-26 DIAGNOSIS — R7303 Prediabetes: Secondary | ICD-10-CM | POA: Insufficient documentation

## 2017-07-26 DIAGNOSIS — Z79899 Other long term (current) drug therapy: Secondary | ICD-10-CM | POA: Insufficient documentation

## 2017-07-26 DIAGNOSIS — Z13220 Encounter for screening for lipoid disorders: Secondary | ICD-10-CM

## 2017-07-26 DIAGNOSIS — Z87898 Personal history of other specified conditions: Secondary | ICD-10-CM

## 2017-07-26 DIAGNOSIS — I10 Essential (primary) hypertension: Secondary | ICD-10-CM

## 2017-07-26 DIAGNOSIS — Z23 Encounter for immunization: Secondary | ICD-10-CM

## 2017-07-26 MED ORDER — HYDROCHLOROTHIAZIDE 12.5 MG PO TABS: 12.5000 mg | ORAL_TABLET | Freq: Every day | ORAL | 2 refills | Status: DC

## 2017-07-26 MED ORDER — AMLODIPINE BESYLATE 5 MG PO TABS: 5.0000 mg | ORAL_TABLET | Freq: Every day | ORAL | 2 refills | Status: DC

## 2017-07-26 MED ORDER — GLUCOSE BLOOD VI STRP: ORAL_STRIP | 12 refills | Status: AC

## 2017-07-26 MED ORDER — TRUEPLUS LANCETS 28G MISC: 1.0000 | Freq: Once | 12 refills | Status: AC

## 2017-07-26 MED FILL — TRUEplus LANCETS 28G MISC: 25 days supply | Qty: 100 | Fill #0

## 2017-07-26 MED FILL — ?HYDROCHLOROTHIAZIDE 12.5MG: 12.5 | 30 days supply | Qty: 30 | Fill #0

## 2017-07-26 MED FILL — TRUE METRIX TEST STRIP: 25 days supply | Qty: 100 | Fill #0

## 2017-07-26 NOTE — Progress Notes (Signed)
Wants screening for  DM and  BP check

## 2017-07-26 NOTE — Progress Notes (Signed)
Subjective:  Patient ID: Kristen George, female    DOB: Oct 14, 1965  Age: 52 y.o. MRN: [id number]  CC: Hypertension   HPI Kristen George presents for follow up. PMH of hypertension.  History of  Hypertension. She is not exercising and is not adherent to low salt diet.  She does not check BP at home. She reports being without her blood pressure medications for 1 month. Cardiac symptoms none. Patient denies chest pain, claudication, dyspnea, lower extremity edema, near-syncope, palpitations and syncope.  Cardiovascular risk factors: hypertension and sedentary lifestyle. Use of agents associated with hypertension: none. History of target organ damage: none. History of prediabetes. She reports taking metformin. She c/o decreased appetite with metformin use. She denies any paresthesias of the feet, N/V, or visual disturbances.  Outpatient Medications Prior to Visit  Medication Sig Dispense Refill  . amLODipine (NORVASC) 2.5 MG tablet Take 1 tablet (2.5 mg total) by mouth daily. 30 tablet 3  . levocetirizine (XYZAL) 5 MG tablet Take 1 tablet (5 mg total) by mouth every evening. (Patient not taking: Reported on 07/26/2017) 30 tablet 5  . loratadine (CLARITIN) 10 MG tablet Take 1 tablet (10 mg total) by mouth daily. (Patient not taking: Reported on 07/26/2017) 30 tablet 11  . mometasone (NASONEX) 50 MCG/ACT nasal spray Place 2 sprays into the nose daily. Two sprays each in each nostril (Patient not taking: Reported on 07/26/2017) 17 g 5  . triamcinolone cream (KENALOG) 0.1 % Apply 1 application topically 2 (two) times daily as needed. Apply to affected areas. (Patient not taking: Reported on 07/26/2017) 30 g 0   No facility-administered medications prior to visit.      Review of Systems  Constitutional: Negative.   Eyes: Negative.   Respiratory: Negative.   Cardiovascular: Negative.   Gastrointestinal: Negative.   Skin: Negative.   Neurological: Negative for tingling.    Objective:  BP (!) 143/91  (BP Location: Left Arm, Patient Position: Sitting, Cuff Size: Large)   Pulse 72   Temp 98.3 F (36.8 C) (Oral)   Resp 16   Wt 215 lb (97.5 kg)   LMP 07/06/2017 (LMP Unknown)   SpO2 100%   BMI 30.85 kg/m   BP/Weight 07/26/2017 07/04/2017 82/99/3716  Systolic BP 967 893 810  Diastolic BP 91 90 74  Wt. (Lbs) 215 215.6 -  BMI 30.85 30.94 -    Physical Exam  Nursing note and vitals reviewed. Constitutional: She appears well-developed and well-nourished.  Eyes: Conjunctivae are normal. Pupils are equal, round, and reactive to light.  Cardiovascular: Normal rate, regular rhythm, normal heart sounds and intact distal pulses.  Respiratory: Effort normal and breath sounds normal.  GI: Soft. Bowel sounds are normal.  Skin: Skin is warm and dry.  Psychiatric: She has a normal mood and affect.    Assessment & Plan:   1. Essential hypertension Schedule BP check with clinical pharmacist or nurse. - Basic metabolic panel - amLODipine (NORVASC) 5 MG tablet; Take 1 tablet (5 mg total) by mouth daily.  Dispense: 30 tablet; Refill: 2 - hydrochlorothiazide (HYDRODIURIL) 12.5 MG tablet; Take 1 tablet (12.5 mg total) by mouth daily.  Dispense: 30 tablet; Refill: 2  2. History of prediabetes Review of current medication list, metformin not active and was d/c in the past. D/c metformin use for now. Will await Hgba1c screening and determine alternatives based on result. - glucose blood test strip; Use as instructed  Dispense: 100 each; Refill: 12 - TRUEPLUS LANCETS 28G MISC; 1 kit by Does  not apply route once for 1 dose.  Dispense: 100 each; Refill: 12 - Hemoglobin A1c  3. Screening cholesterol level - Lipid Panel  4. Screening for colon cancer  - Fecal occult blood, imunochemical([facility])  5. Need for influenza vaccination  - Flu Vaccine QUAD 6+ mos PF IM (Fluarix Quad PF)    Follow-up: Return in about 2 weeks (around 08/09/2017) for BP check Travia or Stacy .   Alfonse Spruce FNP

## 2017-07-26 NOTE — Patient Instructions (Signed)
Managing Your Hypertension Hypertension is commonly called high blood pressure. This is when the force of your blood pressing against the walls of your arteries is too strong. Arteries are blood vessels that carry blood from your heart throughout your body. Hypertension forces the heart to work harder to pump blood, and may cause the arteries to become narrow or stiff. Having untreated or uncontrolled hypertension can cause heart attack, stroke, kidney disease, and other problems. What are blood pressure readings? A blood pressure reading consists of a higher number over a lower number. Ideally, your blood pressure should be below 120/80. The first ("top") number is called the systolic pressure. It is a measure of the pressure in your arteries as your heart beats. The second ("bottom") number is called the diastolic pressure. It is a measure of the pressure in your arteries as the heart relaxes. What does my blood pressure reading mean? Blood pressure is classified into four stages. Based on your blood pressure reading, your health care provider may use the following stages to determine what type of treatment you need, if any. Systolic pressure and diastolic pressure are measured in a unit called mm Hg. Normal  Systolic pressure: below 120.  Diastolic pressure: below 80. Elevated  Systolic pressure: 120-129.  Diastolic pressure: below 80. Hypertension stage 1  Systolic pressure: 130-139.  Diastolic pressure: 80-89. Hypertension stage 2  Systolic pressure: 140 or above.  Diastolic pressure: 90 or above. What health risks are associated with hypertension? Managing your hypertension is an important responsibility. Uncontrolled hypertension can lead to:  A heart attack.  A stroke.  A weakened blood vessel (aneurysm).  Heart failure.  Kidney damage.  Eye damage.  Metabolic syndrome.  Memory and concentration problems.  What changes can I make to manage my  hypertension? Hypertension can be managed by making lifestyle changes and possibly by taking medicines. Your health care provider will help you make a plan to bring your blood pressure within a normal range. Eating and drinking  Eat a diet that is high in fiber and potassium, and low in salt (sodium), added sugar, and fat. An example eating plan is called the DASH (Dietary Approaches to Stop Hypertension) diet. To eat this way: ? Eat plenty of fresh fruits and vegetables. Try to fill half of your plate at each meal with fruits and vegetables. ? Eat whole grains, such as whole wheat pasta, brown rice, or whole grain bread. Fill about one quarter of your plate with whole grains. ? Eat low-fat diary products. ? Avoid fatty cuts of meat, processed or cured meats, and poultry with skin. Fill about one quarter of your plate with lean proteins such as fish, chicken without skin, beans, eggs, and tofu. ? Avoid premade and processed foods. These tend to be higher in sodium, added sugar, and fat.  Reduce your daily sodium intake. Most people with hypertension should eat less than 1,500 mg of sodium a day.  Limit alcohol intake to no more than 1 drink a day for nonpregnant women and 2 drinks a day for men. One drink equals 12 oz of beer, 5 oz of wine, or 1 oz of hard liquor. Lifestyle  Work with your health care provider to maintain a healthy body weight, or to lose weight. Ask what an ideal weight is for you.  Get at least 30 minutes of exercise that causes your heart to beat faster (aerobic exercise) most days of the week. Activities may include walking, swimming, or biking.  Include exercise   to strengthen your muscles (resistance exercise), such as weight lifting, as part of your weekly exercise routine. Try to do these types of exercises for 30 minutes at least 3 days a week.  Do not use any products that contain nicotine or tobacco, such as cigarettes and e-cigarettes. If you need help quitting, ask  your health care provider.  Control any long-term (chronic) conditions you have, such as high cholesterol or diabetes. Monitoring  Monitor your blood pressure at home as told by your health care provider. Your personal target blood pressure may vary depending on your medical conditions, your age, and other factors.  Have your blood pressure checked regularly, as often as told by your health care provider. Working with your health care provider  Review all the medicines you take with your health care provider because there may be side effects or interactions.  Talk with your health care provider about your diet, exercise habits, and other lifestyle factors that may be contributing to hypertension.  Visit your health care provider regularly. Your health care provider can help you create and adjust your plan for managing hypertension. Will I need medicine to control my blood pressure? Your health care provider may prescribe medicine if lifestyle changes are not enough to get your blood pressure under control, and if:  Your systolic blood pressure is 130 or higher.  Your diastolic blood pressure is 80 or higher.  Take medicines only as told by your health care provider. Follow the directions carefully. Blood pressure medicines must be taken as prescribed. The medicine does not work as well when you skip doses. Skipping doses also puts you at risk for problems. Contact a health care provider if:  You think you are having a reaction to medicines you have taken.  You have repeated (recurrent) headaches.  You feel dizzy.  You have swelling in your ankles.  You have trouble with your vision. Get help right away if:  You develop a severe headache or confusion.  You have unusual weakness or numbness, or you feel faint.  You have severe pain in your chest or abdomen.  You vomit repeatedly.  You have trouble breathing. Summary  Hypertension is when the force of blood pumping through  your arteries is too strong. If this condition is not controlled, it may put you at risk for serious complications.  Your personal target blood pressure may vary depending on your medical conditions, your age, and other factors. For most people, a normal blood pressure is less than 120/80.  Hypertension is managed by lifestyle changes, medicines, or both. Lifestyle changes include weight loss, eating a healthy, low-sodium diet, exercising more, and limiting alcohol. This information is not intended to replace advice given to you by your health care provider. Make sure you discuss any questions you have with your health care provider. Document Released: 02/23/2012 Document Revised: 04/28/2016 Document Reviewed: 04/28/2016 Elsevier Interactive Patient Education  2018 [employer]    Diabetes Mellitus and Nutrition When you have diabetes (diabetes mellitus), it is very important to have healthy eating habits because your blood sugar (glucose) levels are greatly affected by what you eat and drink. Eating healthy foods in the appropriate amounts, at about the same times every day, can help you:  Control your blood glucose.  Lower your risk of heart disease.  Improve your blood pressure.  Reach or maintain a healthy weight.  Every person with diabetes is different, and each person has different needs for a meal plan. Your health care   provider may recommend that you work with a diet and nutrition specialist (dietitian) to make a meal plan that is best for you. Your meal plan may vary depending on factors such as:  The calories you need.  The medicines you take.  Your weight.  Your blood glucose, blood pressure, and cholesterol levels.  Your activity level.  Other health conditions you have, such as heart or kidney disease.  How do carbohydrates affect me? Carbohydrates affect your blood glucose level more than any other type of food. Eating carbohydrates naturally increases the amount  of glucose in your blood. Carbohydrate counting is a method for keeping track of how many carbohydrates you eat. Counting carbohydrates is important to keep your blood glucose at a healthy level, especially if you use insulin or take certain oral diabetes medicines. It is important to know how many carbohydrates you can safely have in each meal. This is different for every person. Your dietitian can help you calculate how many carbohydrates you should have at each meal and for snack. Foods that contain carbohydrates include:  Bread, cereal, rice, pasta, and crackers.  Potatoes and corn.  Peas, beans, and lentils.  Milk and yogurt.  Fruit and juice.  Desserts, such as cakes, cookies, ice cream, and candy.  How does alcohol affect me? Alcohol can cause a sudden decrease in blood glucose (hypoglycemia), especially if you use insulin or take certain oral diabetes medicines. Hypoglycemia can be a life-threatening condition. Symptoms of hypoglycemia (sleepiness, dizziness, and confusion) are similar to symptoms of having too much alcohol. If your health care provider says that alcohol is safe for you, follow these guidelines:  Limit alcohol intake to no more than 1 drink per day for nonpregnant women and 2 drinks per day for men. One drink equals 12 oz of beer, 5 oz of wine, or 1 oz of hard liquor.  Do not drink on an empty stomach.  Keep yourself hydrated with water, diet soda, or unsweetened iced tea.  Keep in mind that regular soda, juice, and other mixers may contain a lot of sugar and must be counted as carbohydrates.  What are tips for following this plan? Reading food labels  Start by checking the serving size on the label. The amount of calories, carbohydrates, fats, and other nutrients listed on the label are based on one serving of the food. Many foods contain more than one serving per package.  Check the total grams (g) of carbohydrates in one serving. You can calculate the  number of servings of carbohydrates in one serving by dividing the total carbohydrates by 15. For example, if a food has 30 g of total carbohydrates, it would be equal to 2 servings of carbohydrates.  Check the number of grams (g) of saturated and trans fats in one serving. Choose foods that have low or no amount of these fats.  Check the number of milligrams (mg) of sodium in one serving. Most people should limit total sodium intake to less than 2,300 mg per day.  Always check the nutrition information of foods labeled as "low-fat" or "nonfat". These foods may be higher in added sugar or refined carbohydrates and should be avoided.  Talk to your dietitian to identify your daily goals for nutrients listed on the label. Shopping  Avoid buying canned, premade, or processed foods. These foods tend to be high in fat, sodium, and added sugar.  Shop around the outside edge of the grocery store. This includes fresh fruits and vegetables, bulk   grains, fresh meats, and fresh dairy. Cooking  Use low-heat cooking methods, such as baking, instead of high-heat cooking methods like deep frying.  Cook using healthy oils, such as olive, canola, or sunflower oil.  Avoid cooking with butter, cream, or high-fat meats. Meal planning  Eat meals and snacks regularly, preferably at the same times every day. Avoid going long periods of time without eating.  Eat foods high in fiber, such as fresh fruits, vegetables, beans, and whole grains. Talk to your dietitian about how many servings of carbohydrates you can eat at each meal.  Eat 4-6 ounces of lean protein each day, such as lean meat, chicken, fish, eggs, or tofu. 1 ounce is equal to 1 ounce of meat, chicken, or fish, 1 egg, or 1/4 cup of tofu.  Eat some foods each day that contain healthy fats, such as avocado, nuts, seeds, and fish. Lifestyle   Check your blood glucose regularly.  Exercise at least 30 minutes 5 or more days each week, or as told by  your health care provider.  Take medicines as told by your health care provider.  Do not use any products that contain nicotine or tobacco, such as cigarettes and e-cigarettes. If you need help quitting, ask your health care provider.  Work with a counselor or diabetes educator to identify strategies to manage stress and any emotional and social challenges. What are some questions to ask my health care provider?  Do I need to meet with a diabetes educator?  Do I need to meet with a dietitian?  What number can I call if I have questions?  When are the best times to check my blood glucose? Where to find more information:  American Diabetes Association: diabetes.org/food-and-fitness/food  Academy of Nutrition and Dietetics: www.eatright.org/resources/health/diseases-and-conditions/diabetes  National Institute of Diabetes and Digestive and Kidney Diseases (NIH): www.niddk.nih.gov/health-information/diabetes/overview/diet-eating-physical-activity Summary  A healthy meal plan will help you control your blood glucose and maintain a healthy lifestyle.  Working with a diet and nutrition specialist (dietitian) can help you make a meal plan that is best for you.  Keep in mind that carbohydrates and alcohol have immediate effects on your blood glucose levels. It is important to count carbohydrates and to use alcohol carefully. This information is not intended to replace advice given to you by your health care provider. Make sure you discuss any questions you have with your health care provider. Document Released: 02/25/2005 Document Revised: 07/05/2016 Document Reviewed: 07/05/2016 Elsevier Interactive Patient Education  2018 [employer]vier Inc.  

## 2017-07-27 LAB — LIPID PANEL
CHOL/HDL RATIO: 2.1 ratio (ref 0.0–4.4)
Cholesterol, Total: 154 mg/dL (ref 100–199)
HDL: 72 mg/dL (ref >39)
LDL CALC: 75 mg/dL (ref 0–99)
TRIGLYCERIDES: 34 mg/dL (ref 0–149)
VLDL Cholesterol Cal: 7 mg/dL (ref 5–40)

## 2017-07-27 LAB — HEMOGLOBIN A1C
Est. average glucose Bld gHb Est-mCnc: 134 mg/dL
HEMOGLOBIN A1C: 6.3 % — AB (ref 4.8–5.6)

## 2017-07-27 LAB — BASIC METABOLIC PANEL
BUN/Creatinine Ratio: 17 (ref 9–23)
BUN: 14 mg/dL (ref 6–24)
CO2: 24 mmol/L (ref 20–29)
Calcium: 9.4 mg/dL (ref 8.7–10.2)
Chloride: 103 mmol/L (ref 96–106)
Creatinine, Ser: 0.82 mg/dL (ref 0.57–1.00)
GFR calc Af Amer: 96 mL/min/{1.73_m2} (ref >59)
GFR, EST NON AFRICAN AMERICAN: 84 mL/min/{1.73_m2} (ref >59)
GLUCOSE: 88 mg/dL (ref 65–99)
POTASSIUM: 4 mmol/L (ref 3.5–5.2)
SODIUM: 143 mmol/L (ref 134–144)

## 2017-08-02 ENCOUNTER — Other Ambulatory Visit: Payer: Self-pay | Admitting: Family Medicine

## 2017-08-02 DIAGNOSIS — R7303 Prediabetes: Secondary | ICD-10-CM

## 2017-08-02 MED ORDER — SITAGLIPTIN PHOSPHATE 50 MG PO TABS: 50.0000 mg | ORAL_TABLET | Freq: Every day | ORAL | 2 refills | Status: DC

## 2017-08-02 MED FILL — JANUVIA 50 MG TABLET: 50 | 30 days supply | Qty: 30 | Fill #0

## 2017-08-08 ENCOUNTER — Ambulatory Visit: Payer: Self-pay | Attending: Family Medicine

## 2017-08-11 ENCOUNTER — Ambulatory Visit: Payer: Self-pay | Attending: Family Medicine | Admitting: Pharmacist

## 2017-08-11 ENCOUNTER — Encounter: Payer: Self-pay | Admitting: Pharmacist

## 2017-08-11 VITALS — BP 128/85 | HR 82

## 2017-08-11 DIAGNOSIS — Z79899 Other long term (current) drug therapy: Secondary | ICD-10-CM | POA: Insufficient documentation

## 2017-08-11 DIAGNOSIS — I10 Essential (primary) hypertension: Secondary | ICD-10-CM | POA: Insufficient documentation

## 2017-08-11 NOTE — Progress Notes (Signed)
   S:    Patient arrives in good spirits.  Presents to the clinic for hypertension evaluation.   Patient reports adherence with medications.  Current BP Medications include:  Amlodipine 5 mg daily, HCTZ 12.5 mg daily   O:   Last 3 Office BP readings: BP Readings from Last 3 Encounters:  07/26/17 (!) 143/91  07/04/17 132/90  04/13/17 126/74    BMET    Component Value Date/Time   NA 143 07/26/2017 1647   K 4.0 07/26/2017 1647   CL 103 07/26/2017 1647   CO2 24 07/26/2017 1647   GLUCOSE 88 07/26/2017 1647   GLUCOSE 83 01/17/2014 1219   BUN 14 07/26/2017 1647   CREATININE 0.82 07/26/2017 1647   CALCIUM 9.4 07/26/2017 1647   GFRNONAA 84 07/26/2017 1647   GFRAA 96 07/26/2017 1647    A/P: Hypertension longstanding currently controlled on current medications.  Continued current medications.   Results reviewed and written information provided.   Total time in face-to-face counseling 5 minutes.   F/U Clinic Visit with PCP as directed.  Patient seen with Enid DerryVictoria Mitchell, PharmD Candidate

## 2017-08-11 NOTE — Patient Instructions (Signed)
Thanks for coming to see us!  Blood pressure looks good  Follow up with PCP in May

## 2017-08-31 ENCOUNTER — Ambulatory Visit: Payer: No Typology Code available for payment source

## 2017-09-02 MED FILL — AMLODIPINE BESYLATE 5 MG TA: 5 | 30 days supply | Qty: 30 | Fill #0

## 2017-09-12 ENCOUNTER — Ambulatory Visit: Payer: No Typology Code available for payment source

## 2017-10-07 ENCOUNTER — Ambulatory Visit: Payer: Medicaid Other

## 2017-10-17 ENCOUNTER — Encounter: Payer: Self-pay | Admitting: Nurse Practitioner

## 2017-10-17 ENCOUNTER — Ambulatory Visit: Payer: Self-pay | Attending: Nurse Practitioner | Admitting: Nurse Practitioner

## 2017-10-17 VITALS — BP 133/89 | HR 77 | Temp 99.1°F | Ht 70.0 in | Wt 213.4 lb

## 2017-10-17 DIAGNOSIS — Z7984 Long term (current) use of oral hypoglycemic drugs: Secondary | ICD-10-CM | POA: Insufficient documentation

## 2017-10-17 DIAGNOSIS — Z8249 Family history of ischemic heart disease and other diseases of the circulatory system: Secondary | ICD-10-CM | POA: Insufficient documentation

## 2017-10-17 DIAGNOSIS — Z91013 Allergy to seafood: Secondary | ICD-10-CM | POA: Insufficient documentation

## 2017-10-17 DIAGNOSIS — I10 Essential (primary) hypertension: Secondary | ICD-10-CM | POA: Insufficient documentation

## 2017-10-17 DIAGNOSIS — R7303 Prediabetes: Secondary | ICD-10-CM | POA: Insufficient documentation

## 2017-10-17 DIAGNOSIS — Z8632 Personal history of gestational diabetes: Secondary | ICD-10-CM | POA: Insufficient documentation

## 2017-10-17 DIAGNOSIS — Z91010 Allergy to peanuts: Secondary | ICD-10-CM | POA: Insufficient documentation

## 2017-10-17 DIAGNOSIS — Z79899 Other long term (current) drug therapy: Secondary | ICD-10-CM | POA: Insufficient documentation

## 2017-10-17 LAB — GLUCOSE, POCT (MANUAL RESULT ENTRY): POC GLUCOSE: 108 mg/dL — AB (ref 70–99)

## 2017-10-17 MED ORDER — AMLODIPINE BESYLATE 5 MG PO TABS: 5.0000 mg | ORAL_TABLET | Freq: Every day | ORAL | 1 refills | Status: DC

## 2017-10-17 MED ORDER — SITAGLIPTIN PHOSPHATE 50 MG PO TABS: 50.0000 mg | ORAL_TABLET | Freq: Every day | ORAL | 1 refills | Status: AC

## 2017-10-17 MED FILL — !JANUVIA 50 MG TABLET: 50 | 30 days supply | Qty: 30 | Fill #1

## 2017-10-17 MED FILL — AMLODIPINE BESYLATE 5 MG TA: 5 | 30 days supply | Qty: 30 | Fill #1

## 2017-10-17 NOTE — Patient Instructions (Signed)
Hypertension Hypertension is another name for high blood pressure. High blood pressure forces your heart to work harder to pump blood. This can cause problems over time. There are two numbers in a blood pressure reading. There is a top number (systolic) over a bottom number (diastolic). It is best to have a blood pressure below 120/80. Healthy choices can help lower your blood pressure. You may need medicine to help lower your blood pressure if:  Your blood pressure cannot be lowered with healthy choices.  Your blood pressure is higher than 130/80.  Follow these instructions at home: Eating and drinking  If directed, follow the DASH eating plan. This diet includes: ? Filling half of your plate at each meal with fruits and vegetables. ? Filling one quarter of your plate at each meal with whole grains. Whole grains include whole wheat pasta, brown rice, and whole grain bread. ? Eating or drinking low-fat dairy products, such as skim milk or low-fat yogurt. ? Filling one quarter of your plate at each meal with low-fat (lean) proteins. Low-fat proteins include fish, skinless chicken, eggs, beans, and tofu. ? Avoiding fatty meat, cured and processed meat, or chicken with skin. ? Avoiding premade or processed food.  Eat less than 1,500 mg of salt (sodium) a day.  Limit alcohol use to no more than 1 drink a day for nonpregnant women and 2 drinks a day for men. One drink equals 12 oz of beer, 5 oz of wine, or 1 oz of hard liquor. Lifestyle  Work with your doctor to stay at a healthy weight or to lose weight. Ask your doctor what the best weight is for you.  Get at least 30 minutes of exercise that causes your heart to beat faster (aerobic exercise) most days of the week. This may include walking, swimming, or biking.  Get at least 30 minutes of exercise that strengthens your muscles (resistance exercise) at least 3 days a week. This may include lifting weights or pilates.  Do not use any  products that contain nicotine or tobacco. This includes cigarettes and e-cigarettes. If you need help quitting, ask your doctor.  Check your blood pressure at home as told by your doctor.  Keep all follow-up visits as told by your doctor. This is important. Medicines  Take over-the-counter and prescription medicines only as told by your doctor. Follow directions carefully.  Do not skip doses of blood pressure medicine. The medicine does not work as well if you skip doses. Skipping doses also puts you at risk for problems.  Ask your doctor about side effects or reactions to medicines that you should watch for. Contact a doctor if:  You think you are having a reaction to the medicine you are taking.  You have headaches that keep coming back (recurring).  You feel dizzy.  You have swelling in your ankles.  You have trouble with your vision. Get help right away if:  You get a very bad headache.  You start to feel confused.  You feel weak or numb.  You feel faint.  You get very bad pain in your: ? Chest. ? Belly (abdomen).  You throw up (vomit) more than once.  You have trouble breathing. Summary  Hypertension is another name for high blood pressure.  Making healthy choices can help lower blood pressure. If your blood pressure cannot be controlled with healthy choices, you may need to take medicine. This information is not intended to replace advice given to you by your health care   provider. Make sure you discuss any questions you have with your health care provider. Document Released: 11/17/2007 Document Revised: 04/28/2016 Document Reviewed: 04/28/2016 Elsevier Interactive Patient Education  2018 [employer]  Managing Your Hypertension Hypertension is commonly called high blood pressure. This is when the force of your blood pressing against the walls of your arteries is too strong. Arteries are blood vessels that carry blood from your heart throughout your body.  Hypertension forces the heart to work harder to pump blood, and may cause the arteries to become narrow or stiff. Having untreated or uncontrolled hypertension can cause heart attack, stroke, kidney disease, and other problems. What are blood pressure readings? A blood pressure reading consists of a higher number over a lower number. Ideally, your blood pressure should be below 120/80. The first ("top") number is called the systolic pressure. It is a measure of the pressure in your arteries as your heart beats. The second ("bottom") number is called the diastolic pressure. It is a measure of the pressure in your arteries as the heart relaxes. What does my blood pressure reading mean? Blood pressure is classified into four stages. Based on your blood pressure reading, your health care provider may use the following stages to determine what type of treatment you need, if any. Systolic pressure and diastolic pressure are measured in a unit called mm Hg. Normal  Systolic pressure: below 120.  Diastolic pressure: below 80. Elevated  Systolic pressure: 120-129.  Diastolic pressure: below 80. Hypertension stage 1  Systolic pressure: 130-139.  Diastolic pressure: 80-89. Hypertension stage 2  Systolic pressure: 140 or above.  Diastolic pressure: 90 or above. What health risks are associated with hypertension? Managing your hypertension is an important responsibility. Uncontrolled hypertension can lead to:  A heart attack.  A stroke.  A weakened blood vessel (aneurysm).  Heart failure.  Kidney damage.  Eye damage.  Metabolic syndrome.  Memory and concentration problems.  What changes can I make to manage my hypertension? Hypertension can be managed by making lifestyle changes and possibly by taking medicines. Your health care provider will help you make a plan to bring your blood pressure within a normal range. Eating and drinking  Eat a diet that is high in fiber and potassium,  and low in salt (sodium), added sugar, and fat. An example eating plan is called the DASH (Dietary Approaches to Stop Hypertension) diet. To eat this way: ? Eat plenty of fresh fruits and vegetables. Try to fill half of your plate at each meal with fruits and vegetables. ? Eat whole grains, such as whole wheat pasta, brown rice, or whole grain bread. Fill about one quarter of your plate with whole grains. ? Eat low-fat diary products. ? Avoid fatty cuts of meat, processed or cured meats, and poultry with skin. Fill about one quarter of your plate with lean proteins such as fish, chicken without skin, beans, eggs, and tofu. ? Avoid premade and processed foods. These tend to be higher in sodium, added sugar, and fat.  Reduce your daily sodium intake. Most people with hypertension should eat less than 1,500 mg of sodium a day.  Limit alcohol intake to no more than 1 drink a day for nonpregnant women and 2 drinks a day for men. One drink equals 12 oz of beer, 5 oz of wine, or 1 oz of hard liquor. Lifestyle  Work with your health care provider to maintain a healthy body weight, or to lose weight. Ask what an ideal weight is for  you.  Get at least 30 minutes of exercise that causes your heart to beat faster (aerobic exercise) most days of the week. Activities may include walking, swimming, or biking.  Include exercise to strengthen your muscles (resistance exercise), such as weight lifting, as part of your weekly exercise routine. Try to do these types of exercises for 30 minutes at least 3 days a week.  Do not use any products that contain nicotine or tobacco, such as cigarettes and e-cigarettes. If you need help quitting, ask your health care provider.  Control any long-term (chronic) conditions you have, such as high cholesterol or diabetes. Monitoring  Monitor your blood pressure at home as told by your health care provider. Your personal target blood pressure may vary depending on your medical  conditions, your age, and other factors.  Have your blood pressure checked regularly, as often as told by your health care provider. Working with your health care provider  Review all the medicines you take with your health care provider because there may be side effects or interactions.  Talk with your health care provider about your diet, exercise habits, and other lifestyle factors that may be contributing to hypertension.  Visit your health care provider regularly. Your health care provider can help you create and adjust your plan for managing hypertension. Will I need medicine to control my blood pressure? Your health care provider may prescribe medicine if lifestyle changes are not enough to get your blood pressure under control, and if:  Your systolic blood pressure is 130 or higher.  Your diastolic blood pressure is 80 or higher.  Take medicines only as told by your health care provider. Follow the directions carefully. Blood pressure medicines must be taken as prescribed. The medicine does not work as well when you skip doses. Skipping doses also puts you at risk for problems. Contact a health care provider if:  You think you are having a reaction to medicines you have taken.  You have repeated (recurrent) headaches.  You feel dizzy.  You have swelling in your ankles.  You have trouble with your vision. Get help right away if:  You develop a severe headache or confusion.  You have unusual weakness or numbness, or you feel faint.  You have severe pain in your chest or abdomen.  You vomit repeatedly.  You have trouble breathing. Summary  Hypertension is when the force of blood pumping through your arteries is too strong. If this condition is not controlled, it may put you at risk for serious complications.  Your personal target blood pressure may vary depending on your medical conditions, your age, and other factors. For most people, a normal blood pressure is less  than 120/80.  Hypertension is managed by lifestyle changes, medicines, or both. Lifestyle changes include weight loss, eating a healthy, low-sodium diet, exercising more, and limiting alcohol. This information is not intended to replace advice given to you by your health care provider. Make sure you discuss any questions you have with your health care provider. Document Released: 02/23/2012 Document Revised: 04/28/2016 Document Reviewed: 04/28/2016 Elsevier Interactive Patient Education  [employer].

## 2017-10-17 NOTE — Progress Notes (Signed)
Assessment & Plan:  Kristen George was seen today for establish care and medication refill.  Diagnoses and all orders for this visit:  Essential hypertension -     amLODipine (NORVASC) 5 MG tablet; Take 1 tablet (5 mg total) by mouth daily. Continue all antihypertensives as prescribed.  Remember to bring in your blood pressure log with you for your follow up appointment.  DASH/Mediterranean Diets are healthier choices for HTN.    Prediabetes -     Glucose (CBG) -     sitaGLIPtin (JANUVIA) 50 MG tablet; Take 1 tablet (50 mg total) by mouth daily. Continue blood sugar control as discussed in office today, low carbohydrate diet, and regular physical exercise as tolerated, 150 minutes per week (30 min each day, 5 days per week, or 50 min 3 days per week). Keep blood sugar logs with fasting goal of 80-130 mg/dl, post prandial less than 180.  For Hypoglycemia: BS <60 and Hyperglycemia BS >400; contact the clinic ASAP. Annual eye exams and foot exams are recommended.   Patient has been counseled on age-appropriate routine health concerns for screening and prevention. These are reviewed and up-to-date. Referrals have been placed accordingly. Immunizations are up-to-date or declined.    Subjective:   Chief Complaint  Patient presents with  . Establish Care    Pt. is here to establish care for hypertension and prediabetes.  . Medication Refill   HPI Kristen George 52 y.o. female presents to office today to establish care for HTN and prediabetes.   Prediabetes Chronic. Stable and well controlled. She takes Januvia  daily as prescribed. Denies any hypo or hyperglycemic symptoms. She is due for eye exam. Spot checking her blooding sugars. She did not bring a meter or log with her today. Endorses fasting and post prandial readings 90-160s. Lab Results  Component Value Date   HGBA1C 6.3 (H) 07/26/2017   Essential Hypertension Chronic. Stable and well controlled. She spot checks her blood  pressure at home. Denies chest pain, shortness of breath, palpitations, lightheadedness, dizziness, headaches or BLE edema. Taking amlodipine  as prescribed.  BP Readings from Last 3 Encounters:  10/17/17 133/89  08/11/17 128/85  07/26/17 (!) 143/91   Review of Systems  Constitutional: Negative for fever, malaise/fatigue and weight loss.  HENT: Negative.  Negative for nosebleeds.   Eyes: Negative.  Negative for blurred vision, double vision and photophobia.  Respiratory: Negative.  Negative for cough and shortness of breath.   Cardiovascular: Negative.  Negative for chest pain, palpitations and leg swelling.  Gastrointestinal: Negative.  Negative for heartburn, nausea and vomiting.  Musculoskeletal: Negative.  Negative for myalgias.  Neurological: Negative.  Negative for dizziness, focal weakness, seizures and headaches.  Psychiatric/Behavioral: Negative.  Negative for suicidal ideas.    Past Medical History:  Diagnosis Date  . Gestational diabetes 06/14/2008  . Hypertension 06/14/2006  . Prediabetes     Past Surgical History:  Procedure Laterality Date  . CESAREAN SECTION      Family History  Problem Relation Age of Onset  . Cancer Maternal Aunt   . Hypertension Mother   . Heart attack Father   . Allergic rhinitis Neg Hx   . Angioedema Neg Hx   . Asthma Neg Hx   . Atopy Neg Hx   . Eczema Neg Hx   . Immunodeficiency Neg Hx   . Urticaria Neg Hx     Social History Reviewed with no changes to be made today.   Outpatient Medications Prior to Visit  Medication  Sig Dispense Refill  . glucose blood test strip Use as instructed 100 each 12  . amLODipine (NORVASC) 5 MG tablet Take 1 tablet (5 mg total) by mouth daily. 30 tablet 2  . sitaGLIPtin (JANUVIA) 50 MG tablet Take 1 tablet (50 mg total) by mouth daily. 30 tablet 2  . hydrochlorothiazide (HYDRODIURIL) 12.5 MG tablet Take 1 tablet (12.5 mg total) by mouth daily. (Patient not taking: Reported on 10/17/2017) 30 tablet 2    No facility-administered medications prior to visit.     Allergies  Allergen Reactions  . Other Itching and Rash    Peanuts  . Shrimp [Shellfish Allergy] Itching and Swelling       Objective:    BP 133/89 (BP Location: Left Arm, Patient Position: Sitting, Cuff Size: Normal)   Pulse 77   Temp 99.1 F (37.3 C) (Oral)   Ht  (1.778 m)   Wt 213 lb 6.4 oz (96.8 kg)   SpO2 96%   BMI 30.62 kg/m  Wt Readings from Last 3 Encounters:  10/17/17 213 lb 6.4 oz (96.8 kg)  07/26/17 215 lb (97.5 kg)  07/04/17 215 lb 9.6 oz (97.8 kg)    Physical Exam  Constitutional: She is oriented to person, place, and time. She appears well-developed and well-nourished. She is cooperative.  HENT:  Head: Normocephalic and atraumatic.  Eyes: EOM are normal.  Neck: Normal range of motion.  Cardiovascular: Normal rate, regular rhythm and normal heart sounds. Exam reveals no gallop and no friction rub.  No murmur heard. Pulmonary/Chest: Effort normal and breath sounds normal. No tachypnea. No respiratory distress. She has no decreased breath sounds. She has no wheezes. She has no rhonchi. She has no rales. She exhibits no tenderness.  Abdominal: Soft. Bowel sounds are normal.  Musculoskeletal: Normal range of motion. She exhibits no edema.  Neurological: She is alert and oriented to person, place, and time. Coordination normal.  Skin: Skin is warm and dry.  Psychiatric: She has a normal mood and affect. Her behavior is normal. Judgment and thought content normal.  Nursing note and vitals reviewed.      Patient has been counseled extensively about nutrition and exercise as well as the importance of adherence with medications and regular follow-up. The patient was given clear instructions to go to ER or return to medical center if symptoms don't improve, worsen or new problems develop. The patient verbalized understanding.   Follow-up: Return in about 4 months (around 02/06/2018) for  HTN/Prediabetes.   Claiborne Rigg, FNP-BC [facility] [place], [place] [phone]   10/17/2017, 2:52 PM

## 2017-11-23 MED FILL — JANUVIA 50 MG TABLET: 50 | 30 days supply | Qty: 30 | Fill #2

## 2017-11-23 MED FILL — AMLODIPINE BESYLATE 5 MG TA: 5 | 90 days supply | Qty: 90 | Fill #0

## 2017-11-28 ENCOUNTER — Ambulatory Visit: Payer: Medicaid Other | Admitting: Allergy and Immunology

## 2018-02-06 ENCOUNTER — Ambulatory Visit: Payer: Medicaid Other | Admitting: Nurse Practitioner

## 2018-03-10 ENCOUNTER — Encounter: Payer: Self-pay | Admitting: Nurse Practitioner

## 2018-03-10 ENCOUNTER — Ambulatory Visit: Payer: Self-pay | Attending: Nurse Practitioner | Admitting: Nurse Practitioner

## 2018-03-10 VITALS — BP 164/98 | HR 79 | Temp 99.3°F | Ht 70.0 in | Wt 212.6 lb

## 2018-03-10 DIAGNOSIS — I10 Essential (primary) hypertension: Secondary | ICD-10-CM | POA: Insufficient documentation

## 2018-03-10 DIAGNOSIS — Z79899 Other long term (current) drug therapy: Secondary | ICD-10-CM | POA: Insufficient documentation

## 2018-03-10 DIAGNOSIS — B372 Candidiasis of skin and nail: Secondary | ICD-10-CM | POA: Insufficient documentation

## 2018-03-10 DIAGNOSIS — B3789 Other sites of candidiasis: Secondary | ICD-10-CM

## 2018-03-10 DIAGNOSIS — Z8249 Family history of ischemic heart disease and other diseases of the circulatory system: Secondary | ICD-10-CM | POA: Insufficient documentation

## 2018-03-10 DIAGNOSIS — R7303 Prediabetes: Secondary | ICD-10-CM | POA: Insufficient documentation

## 2018-03-10 LAB — POCT GLYCOSYLATED HEMOGLOBIN (HGB A1C): Hemoglobin A1C: 6.1 % — AB (ref 4.0–5.6)

## 2018-03-10 LAB — GLUCOSE, POCT (MANUAL RESULT ENTRY): POC GLUCOSE: 128 mg/dL — AB (ref 70–99)

## 2018-03-10 MED ORDER — NYSTATIN 100000 UNIT/GM EX POWD: Freq: Three times a day (TID) | CUTANEOUS | 0 refills | Status: AC

## 2018-03-10 MED ORDER — AMLODIPINE BESYLATE 10 MG PO TABS: 10.0000 mg | ORAL_TABLET | Freq: Every day | ORAL | 1 refills | Status: DC

## 2018-03-10 NOTE — Patient Instructions (Signed)
Perimenopause Perimenopause is the time when your body begins to move into the menopause (no menstrual period for 12 straight months). It is a natural process. Perimenopause can begin 2-8 years before the menopause and usually lasts for 1 year after the menopause. During this time, your ovaries may or may not produce an egg. The ovaries vary in their production of estrogen and progesterone hormones each month. This can cause irregular menstrual periods, difficulty getting pregnant, vaginal bleeding between periods, and uncomfortable symptoms. What are the causes?  Irregular production of the ovarian hormones, estrogen and progesterone, and not ovulating every month. Other causes include:  Tumor of the pituitary gland in the brain.  Medical disease that affects the ovaries.  Radiation treatment.  Chemotherapy.  Unknown causes.  Heavy smoking and excessive alcohol intake can bring on perimenopause sooner. What are the signs or symptoms?  Hot flashes.  Night sweats.  Irregular menstrual periods.  Decreased sex drive.  Vaginal dryness.  Headaches.  Mood swings.  Depression.  Memory problems.  Irritability.  Tiredness.  Weight gain.  Trouble getting pregnant.  The beginning of losing bone cells (osteoporosis).  The beginning of hardening of the arteries (atherosclerosis). How is this diagnosed? Your health care provider will make a diagnosis by analyzing your age, menstrual history, and symptoms. He or she will do a physical exam and note any changes in your body, especially your female organs. Female hormone tests may or may not be helpful depending on the amount of female hormones you produce and when you produce them. However, other hormone tests may be helpful to rule out other problems. How is this treated? In some cases, no treatment is needed. The decision on whether treatment is necessary during the perimenopause should be made by you and your health care  provider based on how the symptoms are affecting you and your lifestyle. Various treatments are available, such as:  Treating individual symptoms with a specific medicine for that symptom.  Herbal medicines that can help specific symptoms.  Counseling.  Group therapy. Follow these instructions at home:  Keep track of your menstrual periods (when they occur, how heavy they are, how long between periods, and how long they last) as well as your symptoms and when they started.  Only take over-the-counter or prescription medicines as directed by your health care provider.  Sleep and rest.  Exercise.  Eat a diet that contains calcium (good for your bones) and soy (acts like the estrogen hormone).  Do not smoke.  Avoid alcoholic beverages.  Take vitamin supplements as recommended by your health care provider. Taking vitamin E may help in certain cases.  Take calcium and vitamin D supplements to help prevent bone loss.  Group therapy is sometimes helpful.  Acupuncture may help in some cases. Contact a health care provider if:  You have questions about any symptoms you are having.  You need a referral to a specialist (gynecologist, psychiatrist, or psychologist). Get help right away if:  You have vaginal bleeding.  Your period lasts longer than 8 days.  Your periods are recurring sooner than 21 days.  You have bleeding after intercourse.  You have severe depression.  You have pain when you urinate.  You have severe headaches.  You have vision problems. This information is not intended to replace advice given to you by your health care provider. Make sure you discuss any questions you have with your health care provider. Document Released: 07/08/2004 Document Revised: 11/06/2015 Document Reviewed: 12/28/2012 Elsevier Interactive   Patient Education  2017 [employer]  

## 2018-03-10 NOTE — Progress Notes (Signed)
Kristen George was seen today for follow-up.  Diagnoses and all orders for this visit:  Essential hypertension -     amLODipine (NORVASC) 10 MG tablet; Take 1 tablet (10 mg total) by mouth daily.  Continue all antihypertensives as prescribed.  Remember to bring in your blood pressure log with you for your follow up appointment.  DASH/Mediterranean Diets are healthier choices for HTN.    Prediabetes -     Glucose (CBG) -     HgB A1c Continue blood sugar control as discussed in office today, low carbohydrate diet, and regular physical exercise as tolerated, 150 minutes per week (30 min each day, 5 days per week, or 50 min 3 days per week). Annual eye exams and foot exams are recommended.   Candidiasis of breast -     nystatin (MYCOSTATIN/NYSTOP) powder; Apply topically 3 (three) times daily.   Patient has been counseled on age-appropriate routine health concerns for screening and prevention. These are reviewed and up-to-date. Referrals have been placed accordingly. Immunizations are up-to-date or declined.    Subjective:   Chief Complaint  Patient presents with  . Follow-up    Pt. is here to follow-up on prediabetes and hypertension.    HPI Kristen George 52 y.o. female presents to office today for follow up to HTN and prediabetes.   Prediabetes Stable and well controlled. She is taking Januvia 50 mg daily and reports no adverse side effects. Denies any hypo or hyperglycemic symptoms. A1c is improved and down to 6.1 from 6.3. Lab Results  Component Value Date   HGBA1C 6.1 (A) 03/10/2018    CHRONIC HYPERTENSION  Blood pressure range BP Readings from Last 3 Encounters:  03/10/18 (!) 164/98  10/17/17 133/89  08/11/17 128/85  Blood pressure not well controlled. She is monitoring her blood pressure at home and endorses SBP readings into the 140s.   Chest pain: no   Dyspnea: no   Claudication: no  Medication compliance: yes, amlodipine 10mg  daily  Medication Side  Effects  Lightheadedness: no   Urinary frequency: no   Edema: no   Impotence: no  Preventitive Healthcare:  Exercise: no   Diet Pattern: diet: general  Salt Restriction:  No   Rash: Patient complains of rash  under bilateral breast.   Rash has not changed over time.  Discomfort associated with rash: is pruritic.  Associated symptoms: none. Denies: fever, nausea and vomiting. Patient has not had previous evaluation of rash. Patient has not had previous treatment.  Patient has not had contacts with similar rash. Patient has not identified precipitant. Patient has not had new exposures (soaps, lotions, laundry detergents, foods, medications, plants, insects or animals.)   Review of Systems  Constitutional: Negative for fever, malaise/fatigue and weight loss.  HENT: Negative.  Negative for nosebleeds.   Eyes: Negative.  Negative for blurred vision, double vision and photophobia.  Respiratory: Negative.  Negative for cough and shortness of breath.   Cardiovascular: Negative.  Negative for chest pain, palpitations and leg swelling.  Gastrointestinal: Negative.  Negative for heartburn, nausea and vomiting.  Musculoskeletal: Negative.  Negative for myalgias.  Skin: Positive for itching and rash.  Neurological: Negative.  Negative for dizziness, focal weakness, seizures and headaches.  Psychiatric/Behavioral: Negative.  Negative for suicidal ideas.    Past Medical History:  Diagnosis Date  . Hypertension 06/14/2006  . Prediabetes     Past Surgical History:  Procedure Laterality Date  . CESAREAN SECTION      Family History  Problem Relation Age of  Onset  . Cancer Maternal Aunt   . Hypertension Mother   . Heart attack Father   . Allergic rhinitis Neg Hx   . Angioedema Neg Hx   . Asthma Neg Hx   . Atopy Neg Hx   . Eczema Neg Hx   . Immunodeficiency Neg Hx   . Urticaria Neg Hx     Social History Reviewed with no changes to be made today.   Outpatient Medications Prior to Visit   Medication Sig Dispense Refill  . glucose blood test strip Use as instructed 100 each 12  . sitaGLIPtin (JANUVIA) 50 MG tablet Take 1 tablet (50 mg total) by mouth daily. 90 tablet 1  . amLODipine (NORVASC) 5 MG tablet Take 1 tablet (5 mg total) by mouth daily. 90 tablet 1   No facility-administered medications prior to visit.     Allergies  Allergen Reactions  . Other Itching and Rash    Peanuts  . Shrimp [Shellfish Allergy] Itching and Swelling       Objective:    BP (!) 164/98 (BP Location: Right Arm, Patient Position: Sitting, Cuff Size: Normal)   Pulse 79   Temp 99.3 F (37.4 C) (Oral)   Ht 5\' 10"  (1.778 m)   Wt 212 lb 9.6 oz (96.4 kg)   LMP  (LMP Unknown)   SpO2 100%   BMI 30.50 kg/m  Wt Readings from Last 3 Encounters:  03/10/18 212 lb 9.6 oz (96.4 kg)  10/17/17 213 lb 6.4 oz (96.8 kg)  07/26/17 215 lb (97.5 kg)    Physical Exam  Constitutional: She is oriented to person, place, and time. She appears well-developed and well-nourished. She is cooperative.  HENT:  Head: Normocephalic and atraumatic.  Eyes: EOM are normal.  Neck: Normal range of motion.  Cardiovascular: Normal rate, regular rhythm and normal heart sounds. Exam reveals no gallop and no friction rub.  No murmur heard. Pulmonary/Chest: Effort normal and breath sounds normal. No tachypnea. No respiratory distress. She has no decreased breath sounds. She has no wheezes. She has no rhonchi. She has no rales. She exhibits no tenderness.    Abdominal: Bowel sounds are normal.  Musculoskeletal: Normal range of motion. She exhibits no edema.  Neurological: She is alert and oriented to person, place, and time. Coordination normal.  Skin: Skin is warm and dry.  Psychiatric: She has a normal mood and affect. Her behavior is normal. Judgment and thought content normal.  Nursing note and vitals reviewed.      Patient has been counseled extensively about nutrition and exercise as well as the importance of  adherence with medications and regular follow-up. The patient was given clear instructions to go to ER or return to medical center if symptoms don't improve, worsen or new problems develop. The patient verbalized understanding.   Follow-up: Return in about 3 weeks (around 03/31/2018) for BP recheck with luke.   Claiborne Rigg, FNP-BC [facility] [place], [place] [phone]   03/10/2018, 4:12 PM

## 2018-03-13 MED FILL — NYSTOP 100,000 UNITS/GM PWD: 100000 | 20 days supply | Qty: 60 | Fill #0

## 2018-03-31 ENCOUNTER — Encounter: Payer: Self-pay | Admitting: Pharmacist

## 2018-03-31 ENCOUNTER — Ambulatory Visit: Payer: Self-pay | Attending: Nurse Practitioner | Admitting: Pharmacist

## 2018-03-31 VITALS — BP 153/75 | HR 67

## 2018-03-31 DIAGNOSIS — Z79899 Other long term (current) drug therapy: Secondary | ICD-10-CM | POA: Insufficient documentation

## 2018-03-31 DIAGNOSIS — Z8249 Family history of ischemic heart disease and other diseases of the circulatory system: Secondary | ICD-10-CM | POA: Insufficient documentation

## 2018-03-31 DIAGNOSIS — I10 Essential (primary) hypertension: Secondary | ICD-10-CM | POA: Insufficient documentation

## 2018-03-31 NOTE — Patient Instructions (Signed)
Thank you for coming to see [name] today.   Blood pressure today is elevated.  Please take amlodipine 10 mg daily.   Limiting salt and caffeine, as well as exercising as able for at least 30 minutes for 5 days out of the week, can also help you lower your blood pressure.  Take your blood pressure at home if you are able. Please write down these numbers and bring them to your visits.  If you have any questions about medications, please call me [phone].  Franky Macho

## 2018-03-31 NOTE — Progress Notes (Signed)
   S:    PCP: Bertram Denver  Patient arrives in good spirits. Presents to the clinic for hypertension management. Patient was referred by Zelda on 03/10/18.  Patient was last seen by Primary Care Provider on 03/10/18. BP at that visit 164/98. Amlodipine 10 mg started.  Denies CP, SOB, HA, or blurred vision. Denies dizziness or BLE edema.   Patient denies adherence with medications. Current BP Medications include:   - amlodipine 10 mg daily. Reports taking 5 mg for the past week d/t getting "low pressures".   Dietary habits include:  - limits salt - 1 cup of coffee in the morning Exercise habits include: - walks 1-2 nights/week Family / Social history:  - FH: HTN (mother) - Tobacco: denies  - Alcohol: denies  Home BP readings:  Readings apply to 10 mg daily dose of amlodipine.  - Gives SBPs: 90-110s - Gives DBPs: 60-70s  O:  L arm after 5 minutes: 153/75, HR 67 Last 3 Office BP readings: BP Readings from Last 3 Encounters:  03/10/18 (!) 164/98  10/17/17 133/89  08/11/17 128/85    BMET    Component Value Date/Time   NA 143 07/26/2017 1647   K 4.0 07/26/2017 1647   CL 103 07/26/2017 1647   CO2 24 07/26/2017 1647   GLUCOSE 88 07/26/2017 1647   GLUCOSE 83 01/17/2014 1219   BUN 14 07/26/2017 1647   CREATININE 0.82 07/26/2017 1647   CALCIUM 9.4 07/26/2017 1647   GFRNONAA 84 07/26/2017 1647   GFRAA 96 07/26/2017 1647    Renal function: CrCl cannot be calculated (Patient's most recent lab result is older than the maximum 21 days allowed.).  A/P: Hypertension longstanding currently uncontrolled on current medications. BP Goal <130/80 mmHg. Patient is not adherent with current medications. I reinforced that her pressures are not hypotensive. Gave hypotensive range of <90/60. She denies symptoms of hypotension. Denies getting readings <90/60. Will have her resume 10 mg of amlodipine as she is above goal today. -Restarted amlodipine 10 mg daily.  -F/u labs ordered -  none -Counseled on lifestyle modifications for blood pressure control including reduced dietary sodium, increased exercise, adequate sleep  Results reviewed and written information provided.   Total time in face-to-face counseling 15 minutes.   F/U Clinic Visit in 2 weeks.    Butch Penny, PharmD, CPP Clinical Pharmacist [facility] [phone]

## 2018-05-10 MED FILL — AMLODIPINE BESYLATE 10 MG T: 10 | 30 days supply | Qty: 30 | Fill #0

## 2018-06-14 ENCOUNTER — Encounter (HOSPITAL_COMMUNITY): Payer: Self-pay

## 2018-06-14 ENCOUNTER — Ambulatory Visit (HOSPITAL_COMMUNITY)
Admission: EM | Admit: 2018-06-14 | Discharge: 2018-06-14 | Disposition: A | Discharge status: Home / Self Care | Payer: BLUE CROSS/BLUE SHIELD | Attending: Family Medicine | Admitting: Family Medicine

## 2018-06-14 DIAGNOSIS — M1710 Unilateral primary osteoarthritis, unspecified knee: Secondary | ICD-10-CM | POA: Diagnosis not present

## 2018-06-14 MED ORDER — NAPROXEN 375 MG PO TABS: 375.0000 mg | ORAL_TABLET | Freq: Two times a day (BID) | ORAL | 0 refills | Status: AC

## 2018-06-14 NOTE — ED Provider Notes (Signed)
[facility]   [id number] 06/14/18 Arrival Time: 1346  CC: Bilateral knee pain  SUBJECTIVE: History from: patient. Kristen George is a 53 y.o. female complains of bilateral knee pain R>L that began 1 week ago.  Denies a precipitating event or specific injury.  Localizes the pain to the front of right knee, within the joint.  Describes the pain as constant. Has tried OTC medications with minimal relief.  Symptoms are made worse with knee ROM.  Denies similar symptoms in the past.  Complains of possible swelling.  Denies fever, chills, erythema, ecchymosis, weakness, numbness and tingling.      ROS: As per HPI.  Past Medical History:  Diagnosis Date  . Hypertension 06/14/2006  . Prediabetes    Past Surgical History:  Procedure Laterality Date  . CESAREAN SECTION     Allergies  Allergen Reactions  . Other Itching and Rash    Peanuts  . Shrimp [Shellfish Allergy] Itching and Swelling   No current facility-administered medications on file prior to encounter.    Current Outpatient Medications on File Prior to Encounter  Medication Sig Dispense Refill  . amLODipine (NORVASC) 10 MG tablet Take 1 tablet (10 mg total) by mouth daily. Do not fill today. She will take her current amlodipine (2 tabs). Will pick up new script later 90 tablet 1  . glucose blood test strip Use as instructed 100 each 12  . nystatin (MYCOSTATIN/NYSTOP) powder Apply topically 3 (three) times daily. 60 g 0  . sitaGLIPtin (JANUVIA) 50 MG tablet Take 1 tablet (50 mg total) by mouth daily. 90 tablet 1   Social History   Socioeconomic History  . Marital status: Married    Spouse name: Not on file  . Number of children: Not on file  . Years of education: Not on file  . Highest education level: Not on file  Occupational History  . Not on file  Social Needs  . Financial resource strain: Not on file  . Food insecurity:    Worry: Not on file    Inability: Not on file  . Transportation needs:   Medical: Not on file    Non-medical: Not on file  Tobacco Use  . Smoking status: Never Smoker  . Smokeless tobacco: Never Used  Substance and Sexual Activity  . Alcohol use: No    Alcohol/week: 0.0 standard drinks  . Drug use: No  . Sexual activity: Not Currently  Lifestyle  . Physical activity:    Days per week: Not on file    Minutes per session: Not on file  . Stress: Not on file  Relationships  . Social connections:    Talks on phone: Not on file    Gets together: Not on file    Attends religious service: Not on file    Active member of club or organization: Not on file    Attends meetings of clubs or organizations: Not on file    Relationship status: Not on file  . Intimate partner violence:    Fear of current or ex partner: Not on file    Emotionally abused: Not on file    Physically abused: Not on file    Forced sexual activity: Not on file  Other Topics Concern  . Not on file  Social History Narrative  . Not on file   Family History  Problem Relation Age of Onset  . Cancer Maternal Aunt   . Hypertension Mother   . Heart attack Father   . Allergic  rhinitis Neg Hx   . Angioedema Neg Hx   . Asthma Neg Hx   . Atopy Neg Hx   . Eczema Neg Hx   . Immunodeficiency Neg Hx   . Urticaria Neg Hx     OBJECTIVE:  Vitals:   06/14/18 1438  BP: 139/70  Pulse: 84  Resp: 20  Temp: 97.8 F (36.6 C)  TempSrc: Oral  SpO2: 99%    General appearance: AOx3; in no acute distress.  Head: NCAT Lungs: CTA bilaterally Heart: RRR.  Clear S1 and S2 without murmur, gallops, or rubs.  Radial pulses 2+ bilaterally. Musculoskeletal: Bilateral knee Inspection: Skin warm, dry, clear and intact without obvious erythema, effusion, or ecchymosis.  Mild valgus deformity Palpation: TTP of anterior aspect and lateral joint line of RT knee; TTP of anterior aspect and medial joint line of LT knee ROM: FROM active and passive Strength: 5/5 knee abduction, 5/5 knee adduction, 5/5 knee  flexion, 5/5 knee extension Stability: Anterior/ posterior drawer intact Skin: warm and dry Neurologic: Ambulates without difficulty; Sensation intact about the lower extremities Psychological: alert and cooperative; normal mood and affect  ASSESSMENT & PLAN:  1. Arthritis of knee     Meds ordered this encounter  Medications  . naproxen (NAPROSYN) 375 MG tablet    Sig: Take 1 tablet (375 mg total) by mouth 2 (two) times daily.    Dispense:  20 tablet    Refill:  0    Order Specific Question:   Supervising Provider    Answer:   Eustace Moore [[id number]]   Knee brace given Continue conservative management of rest, ice, and gentle stretches Take naproxen as needed for pain relief (may cause abdominal discomfort, ulcers, and GI bleeds avoid taking with other NSAIDs) Follow up with PCP or with orthopedist if symptoms persist Return or go to the ER if you have any new or worsening symptoms (fever, chills, chest pain, abdominal pain, changes in bowel or bladder habits, pain radiating into lower legs, etc...)   Reviewed expectations re: course of current medical issues. Questions answered. Outlined signs and symptoms indicating need for more acute intervention. Patient verbalized understanding. After Visit Summary given.    Rennis Harding, PA-C 06/14/18 1510

## 2018-06-14 NOTE — ED Triage Notes (Signed)
Pt presents with pain in both knees not associated with a particular even; the right knee having greater pain than the left.

## 2018-06-14 NOTE — Discharge Instructions (Addendum)
Knee brace given Continue conservative management of rest, ice, and gentle stretches Take naproxen as needed for pain relief (may cause abdominal discomfort, ulcers, and GI bleeds avoid taking with other NSAIDs) Follow up with PCP or with orthopedist if symptoms persist Return or go to the ER if you have any new or worsening symptoms (fever, chills, chest pain, abdominal pain, changes in bowel or bladder habits, pain radiating into lower legs, etc...)

## 2018-06-20 ENCOUNTER — Other Ambulatory Visit: Payer: Self-pay | Admitting: Nurse Practitioner

## 2018-06-20 ENCOUNTER — Ambulatory Visit (HOSPITAL_COMMUNITY)
Admission: RE | Admit: 2018-06-20 | Discharge: 2018-06-20 | Disposition: A | Discharge status: Home / Self Care | Payer: BLUE CROSS/BLUE SHIELD | Source: Ambulatory Visit | Attending: Nurse Practitioner | Admitting: Nurse Practitioner

## 2018-06-20 ENCOUNTER — Ambulatory Visit (HOSPITAL_BASED_OUTPATIENT_CLINIC_OR_DEPARTMENT_OTHER): Payer: BLUE CROSS/BLUE SHIELD | Admitting: Nurse Practitioner

## 2018-06-20 ENCOUNTER — Encounter: Payer: Self-pay | Admitting: Nurse Practitioner

## 2018-06-20 VITALS — BP 144/86 | HR 81 | Temp 97.9°F | Ht 70.0 in | Wt 226.0 lb

## 2018-06-20 DIAGNOSIS — M25561 Pain in right knee: Principal | ICD-10-CM

## 2018-06-20 DIAGNOSIS — I10 Essential (primary) hypertension: Secondary | ICD-10-CM | POA: Diagnosis not present

## 2018-06-20 DIAGNOSIS — G8929 Other chronic pain: Secondary | ICD-10-CM

## 2018-06-20 MED ORDER — AMLODIPINE BESYLATE 10 MG PO TABS: 10.0000 mg | ORAL_TABLET | Freq: Every day | ORAL | 1 refills | Status: AC

## 2018-06-20 NOTE — Patient Instructions (Signed)
How to Use a Knee Immobilizer  A knee immobilizer, also called a knee brace, is used to support and protect an injured or painful knee. You may also have to wear it after knee surgery. A knee brace keeps your knee from moving or bending while it is healing. Wear and remove your knee brace only as told by your doctor. In general, your knee brace should:  Have straps, hooks, or tapes that fasten snugly around your leg.  Not feel too tight or too loose. Follow these instructions at home:  While resting, raise (elevate) your leg above the level of your heart. Pillows can be used for support. Doing this reduces throbbing and helps with healing.  Loosen the brace if your toes tingle or if you notice other symptoms or signs that it is too tight, such as: ? Swelling. ? Numbness. ? Color change in your foot or ankle. ? More pain.  Keep the brace clean.  If the brace is not waterproof: ? Do not let it get wet. ? Cover it with a watertight covering when you take a bath or a shower.  If your doctor says that you may take off (remove) the brace: ? Take it off before you take a bath or a shower. ? Check for any skin irritation. ? Use a towel to dry the area completely before you put the brace back on. Contact a doctor if:  Your knee brace breaks or needs to be replaced.  You have more pain or swelling in your knee, foot, or ankle.  Your knee brace is not helping.  Your knee brace makes your knee pain worse. Summary  A knee immobilizer, also called a knee brace, keeps your knee from moving or bending while it is healing.  Different knee braces will have different instructions for use. Follow the instructions from your doctor.  Call your doctor if you have more pain or swelling, if your knee brace breaks, or if it does not help your knee pain. This information is not intended to replace advice given to you by your health care provider. Make sure you discuss any questions you have with  your health care provider. Document Released: 03/09/2008 Document Revised: 05/17/2016 Document Reviewed: 05/17/2016 Elsevier Interactive Patient Education  2019 [employer]  Acute Knee Pain, Adult Many things can cause knee pain. Sometimes, knee pain is sudden (acute) and may be caused by damage, swelling, or irritation of the muscles and tissues that support your knee. The pain often goes away on its own with time and rest. If the pain does not go away, tests may be done to find out what is causing the pain. Follow these instructions at home: Pay attention to any changes in your symptoms. Take these actions to relieve your pain. If you have a knee sleeve or brace:   Wear the sleeve or brace as told by your doctor. Remove it only as told by your doctor.  Loosen the sleeve or brace if your toes: ? Tingle. ? Become numb. ? Turn cold and blue.  Keep the sleeve or brace clean.  If the sleeve or brace is not waterproof: ? Do not let it get wet. ? Cover it with a watertight covering when you take a bath or shower. Activity  Rest your knee.  Do not do things that cause pain.  Avoid activities where both feet leave the ground at the same time (high-impact activities). Examples are running, jumping rope, and doing jumping jacks.  Work  with a physical therapist to make a safe exercise program, as told by your doctor. Managing pain, stiffness, and swelling   If told, put ice on the knee: ? Put ice in a plastic bag. ? Place a towel between your skin and the bag. ? Leave the ice on for 20 minutes, 2-3 times a day.  If told, put pressure (compression) on your injured knee to control swelling, give support, and help with discomfort. Compression may be done with an elastic bandage. General instructions  Take all medicines only as told by your doctor.  Raise (elevate) your knee while you are sitting or lying down. Make sure your knee is higher than your heart.  Sleep with a pillow  under your knee.  Do not use any products that contain nicotine or tobacco. These include cigarettes, e-cigarettes, and chewing tobacco. These products may slow down healing. If you need help quitting, ask your doctor.  If you are overweight, work with your doctor and a food expert (dietitian) to set goals to lose weight. Being overweight can make your knee hurt more.  Keep all follow-up visits as told by your doctor. This is important. Contact a doctor if:  The knee pain does not stop.  The knee pain changes or gets worse.  You have a fever along with knee pain.  Your knee feels warm when you touch it.  Your knee gives out or locks up. Get help right away if:  Your knee swells, and the swelling gets worse.  You cannot move your knee.  You have very bad knee pain. Summary  Many things can cause knee pain. The pain often goes away on its own with time and rest.  Your doctor may do tests to find out the cause of the pain.  Pay attention to any changes in your symptoms. Relieve your pain with rest, medicines, light activity, and use of ice.  Get help right away if you cannot move your knee or your knee pain is very bad. This information is not intended to replace advice given to you by your health care provider. Make sure you discuss any questions you have with your health care provider. Document Released: 08/27/2008 Document Revised: 11/10/2017 Document Reviewed: 11/10/2017 Elsevier Interactive Patient Education  2019 [employer]

## 2018-06-20 NOTE — Progress Notes (Signed)
Assessment & Plan:  Kristen George was seen today for hospitalization follow-up.  Diagnoses and all orders for this visit:  Essential hypertension -     amLODipine (NORVASC) 10 MG tablet; Take 1 tablet (10 mg total) by mouth daily. Do not fill today. She will take her current amlodipine (2 tabs). Will pick up new script later Continue all antihypertensives as prescribed.  Remember to bring in your blood pressure log with you for your follow up appointment.  DASH/Mediterranean Diets are healthier choices for HTN.   Chronic pain of right knee -     DG Knee Complete 4 Views Right; Future Work on losing weight to help reduce joint pain. May alternate with heat and ice application for pain relief. May also alternate with acetaminophen and Ibuprofen as prescribed pain relief. Other alternatives include massage, acupuncture and water aerobics.  You must stay active and avoid a sedentary lifestyle.   Patient has been counseled on age-appropriate routine health concerns for screening and prevention. These are reviewed and up-to-date. Referrals have been placed accordingly. Immunizations are up-to-date or declined.    Subjective:   Chief Complaint  Patient presents with  . Hospitalization Follow-up   HPI  Kristen George 53 y.o. female presents to office today for follow up. She has complaints of right knee pain.   Essential Hypertension Chronic and not well controlled today. However she reports she worked last night and has only had 1 hour of sleep this morning prior to her office appointment. She endorses medication compliance taking amlodipine 10mg  daily. Reports blood pressures at home as normal. She currently denies chest pain, shortness of breath, palpitations, lightheadedness, dizziness, headaches or BLE edema.  BP Readings from Last 3 Encounters:  06/20/18 (!) 144/86  06/14/18 139/70  03/31/18 (!) 153/75    Knee Pain Patient presents for follow up on a knee problem involving the  right  knee. Onset of the symptoms was several weeks ago. Inciting event: none known. Current symptoms include crepitus sensation, giving out, locking, popping sensation, stiffness and swelling. Pain is aggravated by any weight bearing and walking.  Patient has had prior knee problems. Evaluation to date: none. Treatment to date: prescription NSAIDS which are somewhat effective.Bilateral knee pain: R knee 7/10. Left knee 4/10  Review of Systems  Constitutional: Negative for fever, malaise/fatigue and weight loss.  HENT: Negative.  Negative for nosebleeds.   Eyes: Negative.  Negative for blurred vision, double vision and photophobia.  Respiratory: Negative.  Negative for cough and shortness of breath.   Cardiovascular: Negative.  Negative for chest pain, palpitations and leg swelling.  Gastrointestinal: Negative.  Negative for heartburn, nausea and vomiting.  Musculoskeletal: Positive for joint pain. Negative for myalgias.  Neurological: Negative.  Negative for dizziness, focal weakness, seizures and headaches.  Psychiatric/Behavioral: Negative.  Negative for suicidal ideas.    Past Medical History:  Diagnosis Date  . Hypertension 06/14/2006  . Prediabetes     Past Surgical History:  Procedure Laterality Date  . CESAREAN SECTION      Family History  Problem Relation Age of Onset  . Cancer Maternal Aunt   . Hypertension Mother   . Heart attack Father   . Allergic rhinitis Neg Hx   . Angioedema Neg Hx   . Asthma Neg Hx   . Atopy Neg Hx   . Eczema Neg Hx   . Immunodeficiency Neg Hx   . Urticaria Neg Hx     Social History Reviewed with no changes to be made today.  Outpatient Medications Prior to Visit  Medication Sig Dispense Refill  . glucose blood test strip Use as instructed 100 each 12  . naproxen (NAPROSYN) 375 MG tablet Take 1 tablet (375 mg total) by mouth 2 (two) times daily. 20 tablet 0  . nystatin (MYCOSTATIN/NYSTOP) powder Apply topically 3 (three) times daily. 60 g 0    . sitaGLIPtin (JANUVIA) 50 MG tablet Take 1 tablet (50 mg total) by mouth daily. 90 tablet 1  . amLODipine (NORVASC) 10 MG tablet Take 1 tablet (10 mg total) by mouth daily. Do not fill today. She will take her current amlodipine (2 tabs). Will pick up new script later 90 tablet 1   No facility-administered medications prior to visit.     Allergies  Allergen Reactions  . Other Itching and Rash    Peanuts  . Shrimp [Shellfish Allergy] Itching and Swelling       Objective:    BP (!) 144/86 (BP Location: Left Arm, Patient Position: Sitting)   Pulse 81   Temp 97.9 F (36.6 C) (Oral)   Ht 5\' 10"  (1.778 m)   Wt 226 lb (102.5 kg)   SpO2 99%   BMI 32.43 kg/m  Wt Readings from Last 3 Encounters:  06/20/18 226 lb (102.5 kg)  03/10/18 212 lb 9.6 oz (96.4 kg)  10/17/17 213 lb 6.4 oz (96.8 kg)    Physical Exam Vitals signs and nursing note reviewed.  Constitutional:      Appearance: She is well-developed.  HENT:     Head: Normocephalic and atraumatic.  Neck:     Musculoskeletal: Normal range of motion.  Cardiovascular:     Rate and Rhythm: Normal rate and regular rhythm.     Heart sounds: Normal heart sounds. No murmur. No friction rub. No gallop.   Pulmonary:     Effort: Pulmonary effort is normal. No tachypnea or respiratory distress.     Breath sounds: Normal breath sounds. No decreased breath sounds, wheezing, rhonchi or rales.  Chest:     Chest wall: No tenderness.  Abdominal:     General: Bowel sounds are normal.     Palpations: Abdomen is soft.  Musculoskeletal:     Right knee: She exhibits decreased range of motion, swelling and abnormal patellar mobility. Tenderness found. Lateral joint line, LCL and patellar tendon tenderness noted.  Skin:    General: Skin is warm and dry.  Neurological:     Mental Status: She is alert and oriented to person, place, and time.     Coordination: Coordination normal.  Psychiatric:        Behavior: Behavior normal. Behavior is  cooperative.        Thought Content: Thought content normal.        Judgment: Judgment normal.        Patient has been counseled extensively about nutrition and exercise as well as the importance of adherence with medications and regular follow-up. The patient was given clear instructions to go to ER or return to medical center if symptoms don't improve, worsen or new problems develop. The patient verbalized understanding.   Follow-up: Return in about 3 months (around 09/08/2018) for DM, HTN.   Claiborne RiggZelda W Fleming, FNP-BC [facility] [place], [place] [phone]   06/21/2018, 9:15 AM

## 2018-06-21 ENCOUNTER — Encounter: Payer: Self-pay | Admitting: Nurse Practitioner

## 2018-06-27 ENCOUNTER — Telehealth (INDEPENDENT_AMBULATORY_CARE_PROVIDER_SITE_OTHER): Payer: Self-pay

## 2018-06-27 NOTE — Telephone Encounter (Signed)
-----   Message from Claiborne Rigg, NP sent at 06/20/2018  8:31 PM EST ----- Xray showing degeneration of bone in several areas of the knee. Will refer you to orthopedics. Please wear your knee brace in the meantime, alternate with heat and ice for pain relief. Take naproxen sparingly.

## 2018-06-27 NOTE — Telephone Encounter (Signed)
Patient is aware that xray shows degeneration of bone in several areas of the knee. Will refer to ortho. Patient is scheduled with ortho. Advised patient to wear knee brace in the meantime, alternate with heat and ice for pain and take naproxen sparingly. Maryjean Morn, CMA

## 2018-06-28 ENCOUNTER — Ambulatory Visit (INDEPENDENT_AMBULATORY_CARE_PROVIDER_SITE_OTHER): Payer: BLUE CROSS/BLUE SHIELD | Admitting: Orthopaedic Surgery

## 2018-06-28 ENCOUNTER — Encounter (INDEPENDENT_AMBULATORY_CARE_PROVIDER_SITE_OTHER): Payer: Self-pay | Admitting: Orthopaedic Surgery

## 2018-06-28 DIAGNOSIS — G8929 Other chronic pain: Secondary | ICD-10-CM | POA: Diagnosis not present

## 2018-06-28 DIAGNOSIS — M25561 Pain in right knee: Secondary | ICD-10-CM

## 2018-06-28 MED ORDER — LIDOCAINE HCL 1 % IJ SOLN
5.0000 mL | INTRAMUSCULAR | Status: AC | PRN
  Administered 2018-06-28: 5 mL

## 2018-06-28 MED ORDER — METHYLPREDNISOLONE ACETATE 40 MG/ML IJ SUSP
40.0000 mg | INTRAMUSCULAR | Status: AC | PRN
  Administered 2018-06-28: 40 mg via INTRA_ARTICULAR

## 2018-06-28 NOTE — Progress Notes (Signed)
Office Visit Note   Patient: Kristen George           Date of Birth: 24-Jan-1966           MRN: [id number] Visit Date: 06/28/2018              Requested by: Claiborne Rigg, NP [address] [place], [place] [address] PCP: Claiborne Rigg, NP   Assessment & Plan: Visit Diagnoses:  1. Chronic pain of right knee     Plan: She is to be mindful of any mechanical symptoms and these are discussed with her.  She is shown quad strengthening exercises.  Knee friendly exercises discussed.  She will follow-up with [name] in 2 weeks to see what type of response she had to the injection. Questions encouraged and answered by Dr. Magnus Ivan and myself.  Follow-Up Instructions: Return in about 2 weeks (around 07/12/2018).   Orders:  Orders Placed This Encounter  Procedures  . Large Joint Inj   No orders of the defined types were placed in this encounter.     Procedures: Large Joint Inj on 06/28/2018 9:59 AM Indications: pain Details: 22 G 1.5 in needle, anterolateral approach  Arthrogram: No  Medications: 5 mL lidocaine 1 %; 40 mg methylPREDNISolone acetate 40 MG/ML Aspirate: 2 mL yellow Outcome: tolerated well, no immediate complications Procedure, treatment alternatives, risks and benefits explained, specific risks discussed. Consent was given by the patient. Immediately prior to procedure a time out was called to verify the correct patient, procedure, equipment, support staff and site/side marked as required. Patient was prepped and draped in the usual sterile fashion.       Clinical Data: No additional findings.   Subjective: Chief Complaint  Patient presents with  . 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  She has had bilateral knee pain for greater than a year however her pains became worse over the last 2 to 3 weeks.  She was seen in the ER over the first of the year radiographs were obtained of both knees.  These  were non-standing films.  Showed some mild narrowing in all 3 compartments but no acute fracture.  I personally reviewed the films and reviewed them with the patient today.  She states she is been having some painful popping giving way and rare catching of the right knee.  She has been taking naproxen which helps some.  She has had no known injury.  She is diabetic reports her last hemoglobin A1c to be 6.2. Review of Systems Please see HPI otherwise negative  Objective: Vital Signs: There were no vitals taken for this visit.  Physical Exam Constitutional:      Appearance: She is not ill-appearing or diaphoretic.  Pulmonary:     Effort: Pulmonary effort is normal.  Neurological:     Mental Status: She is alert and oriented to person, place, and time.  Psychiatric:        Mood and Affect: Mood normal.     Ortho Exam Bilateral knees no abnormal warmth erythema or skin lesions.  Right knee with slight edema compared to the left plus minus effusion.  Global tenderness to right knee.  Valgus deformities of both knees.  No instability valgus varus stressing of either knee.  Positive McMurray's on the right negative on the left.  Good range of motion of both knees.  She has tenderness along medial joint line of the left  knee. Specialty Comments:  No specialty comments available.  Imaging: No results found.   PMFS History: Patient Active Problem List   Diagnosis Date Noted  . Food allergy 07/04/2017  . Urticaria 07/04/2017  . Perennial and seasonal allergic rhinitis 07/04/2017  . Atypical glandular cells of undetermined significance (AGUS) on cervical Pap smear 11/18/2016  . Menorrhagia with regular cycle 11/11/2016  . Prediabetes 03/02/2016   Past Medical History:  Diagnosis Date  . Hypertension 06/14/2006  . Prediabetes     Family History  Problem Relation Age of Onset  . Cancer Maternal Aunt   . Hypertension Mother   . Heart attack Father   . Allergic rhinitis Neg Hx   .  Angioedema Neg Hx   . Asthma Neg Hx   . Atopy Neg Hx   . Eczema Neg Hx   . Immunodeficiency Neg Hx   . Urticaria Neg Hx     Past Surgical History:  Procedure Laterality Date  . CESAREAN SECTION     Social History   Occupational History  . Not on file  Tobacco Use  . Smoking status: Never Smoker  . Smokeless tobacco: Never Used  Substance and Sexual Activity  . Alcohol use: No    Alcohol/week: 0.0 standard drinks  . Drug use: No  . Sexual activity: Not Currently

## 2018-07-12 ENCOUNTER — Ambulatory Visit (INDEPENDENT_AMBULATORY_CARE_PROVIDER_SITE_OTHER): Payer: BLUE CROSS/BLUE SHIELD | Admitting: Orthopaedic Surgery

## 2018-07-13 MED FILL — JANUVIA 50 MG TABLET: 50 | 30 days supply | Qty: 30 | Fill #0

## 2018-07-13 MED FILL — AMLODIPINE BESYLATE 10 MG T: 10 | 30 days supply | Qty: 30 | Fill #1

## 2018-07-17 ENCOUNTER — Ambulatory Visit (INDEPENDENT_AMBULATORY_CARE_PROVIDER_SITE_OTHER): Payer: BLUE CROSS/BLUE SHIELD | Admitting: Orthopaedic Surgery

## 2018-07-17 ENCOUNTER — Other Ambulatory Visit (INDEPENDENT_AMBULATORY_CARE_PROVIDER_SITE_OTHER): Payer: Self-pay

## 2018-07-17 ENCOUNTER — Encounter (INDEPENDENT_AMBULATORY_CARE_PROVIDER_SITE_OTHER): Payer: Self-pay | Admitting: Orthopaedic Surgery

## 2018-07-17 DIAGNOSIS — G8929 Other chronic pain: Secondary | ICD-10-CM

## 2018-07-17 DIAGNOSIS — M25561 Pain in right knee: Secondary | ICD-10-CM | POA: Diagnosis not present

## 2018-07-17 DIAGNOSIS — E119 Type 2 diabetes mellitus without complications: Secondary | ICD-10-CM

## 2018-07-17 NOTE — Progress Notes (Signed)
The patient is someone I am seeing for the first time but she was just seen 2 weeks ago by Delane Ginger.  She is a 53 year old been having some knee pain and has been in and out of the ER with knee pain.  He did provide a steroid injection in her right knee and this helped her significantly.  She still has some pain with going up and down stairs.  She is a diabetic but does not report the steroid injection adversely affected her blood glucose.  On examination of her right knee she does have patellofemoral crepitation but the knee is ligamentously stable.  There is no effusion today.  She understands the quad strengthening exercises will be important as well as weight loss.  We will send her to [facility] outpatient physical therapy for quad strengthening of both her knees and other modalities to help decrease her knee pain.  We will see her back in about 6 weeks to see how she is doing overall.

## 2018-08-14 ENCOUNTER — Other Ambulatory Visit: Payer: Self-pay | Admitting: Nurse Practitioner

## 2018-08-28 ENCOUNTER — Ambulatory Visit (INDEPENDENT_AMBULATORY_CARE_PROVIDER_SITE_OTHER): Payer: BLUE CROSS/BLUE SHIELD | Admitting: Physician Assistant

## 2018-09-27 MED FILL — AMLODIPINE BESYLATE 10 MG T: 10 | 30 days supply | Qty: 30 | Fill #2

## 2018-09-27 MED FILL — JANUVIA 50 MG TABLET: 50 | 30 days supply | Qty: 30 | Fill #1

## 2018-10-03 ENCOUNTER — Ambulatory Visit: Payer: BLUE CROSS/BLUE SHIELD | Admitting: Nurse Practitioner

## 2019-07-12 IMAGING — DX DG KNEE COMPLETE 4+V*R*
4 series · 4 of 4 positions shown · non-contrast
Comparison: None.

CLINICAL DATA: 52-year-old female with lateral right knee pain and
swelling for the past 2 weeks. No injury. Initial encounter.

EXAM:
RIGHT KNEE - COMPLETE 4+ VIEW

[knee ap]
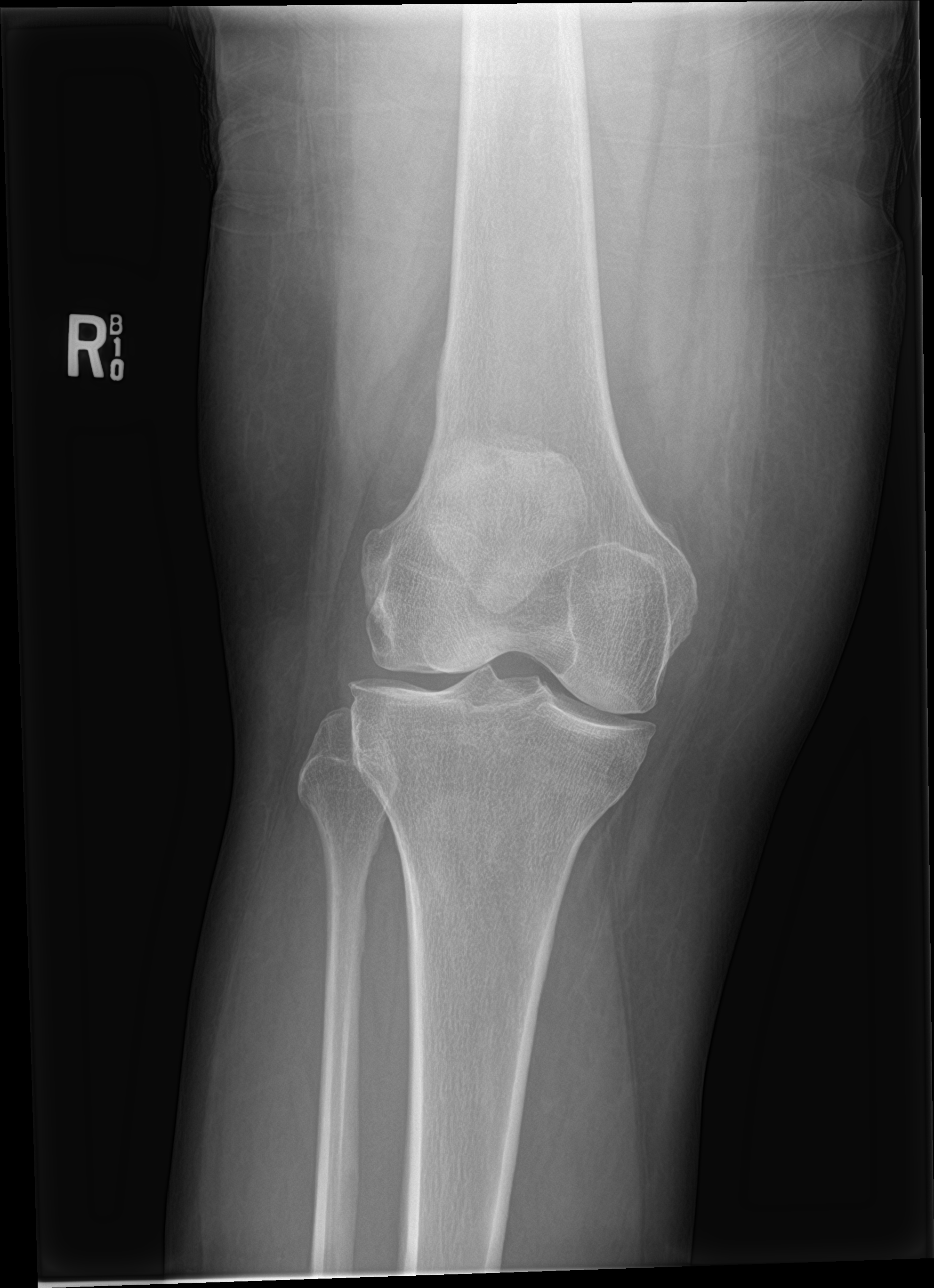

[knee lat]
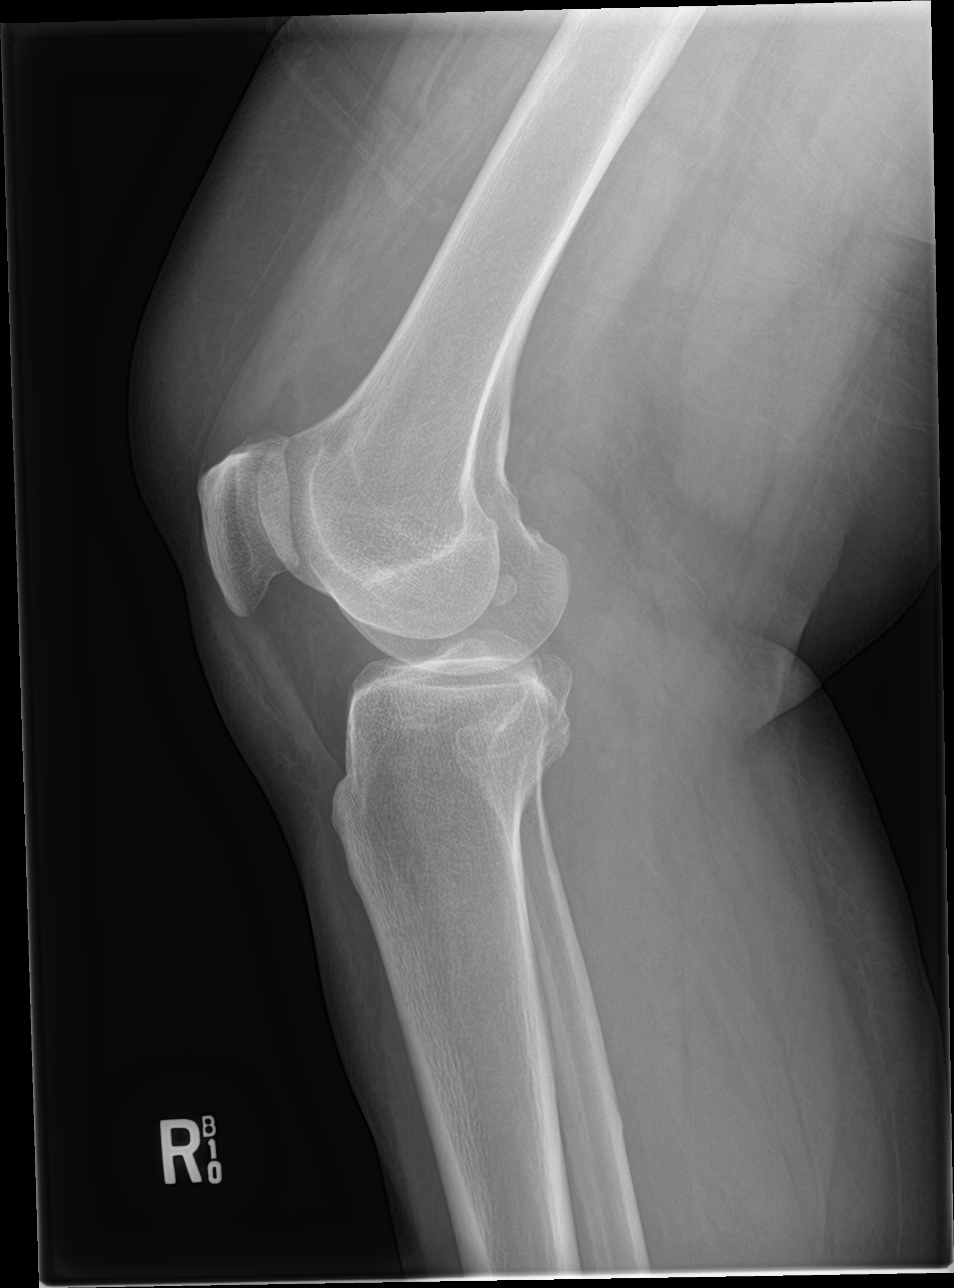

[knee obl (1 of 2)]
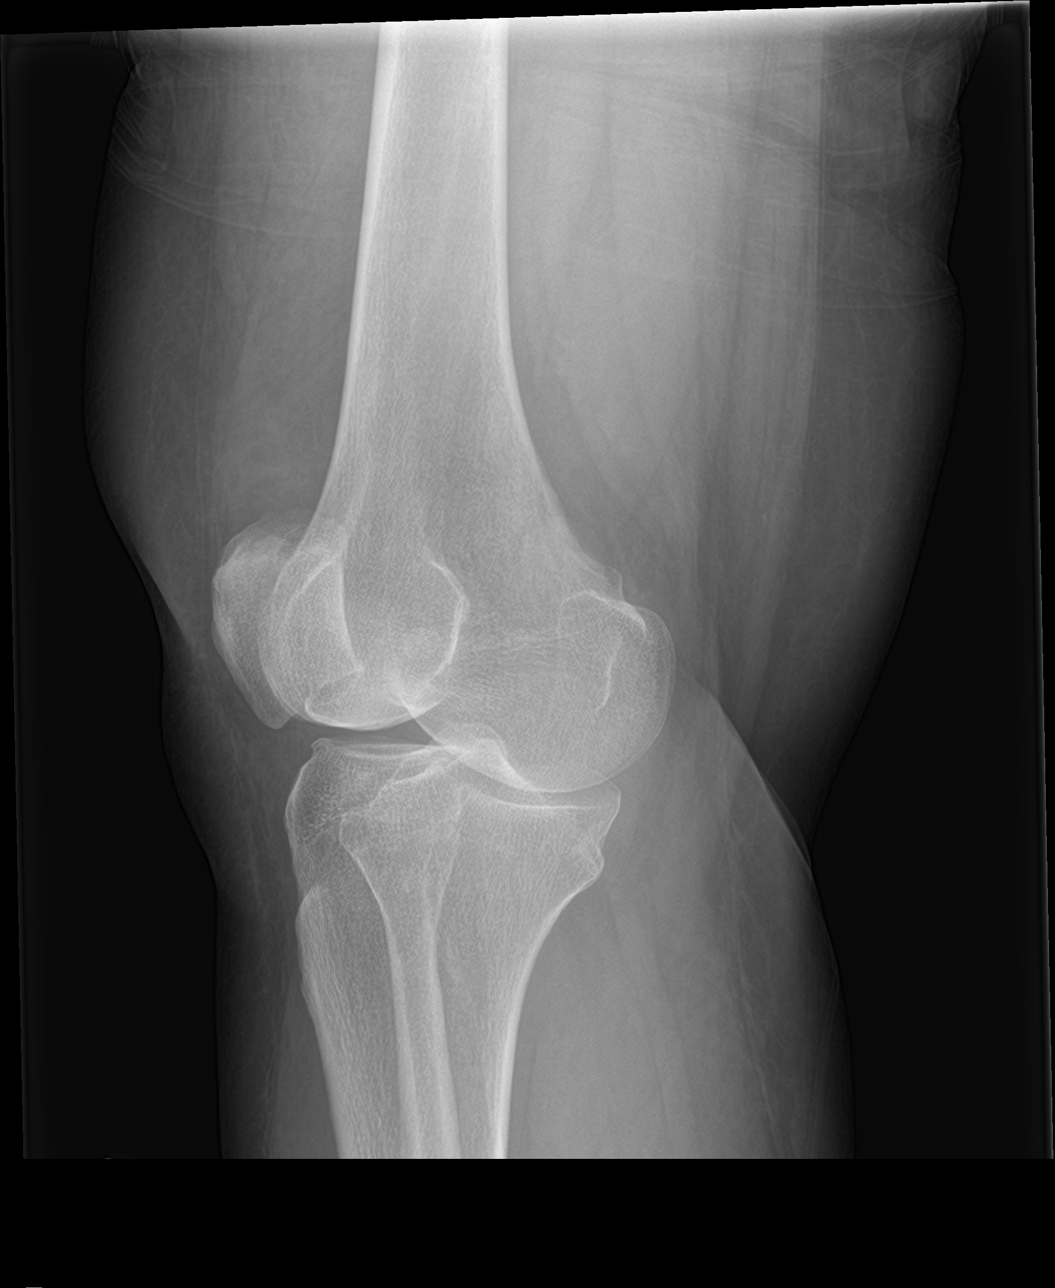

[knee obl (2 of 2)]
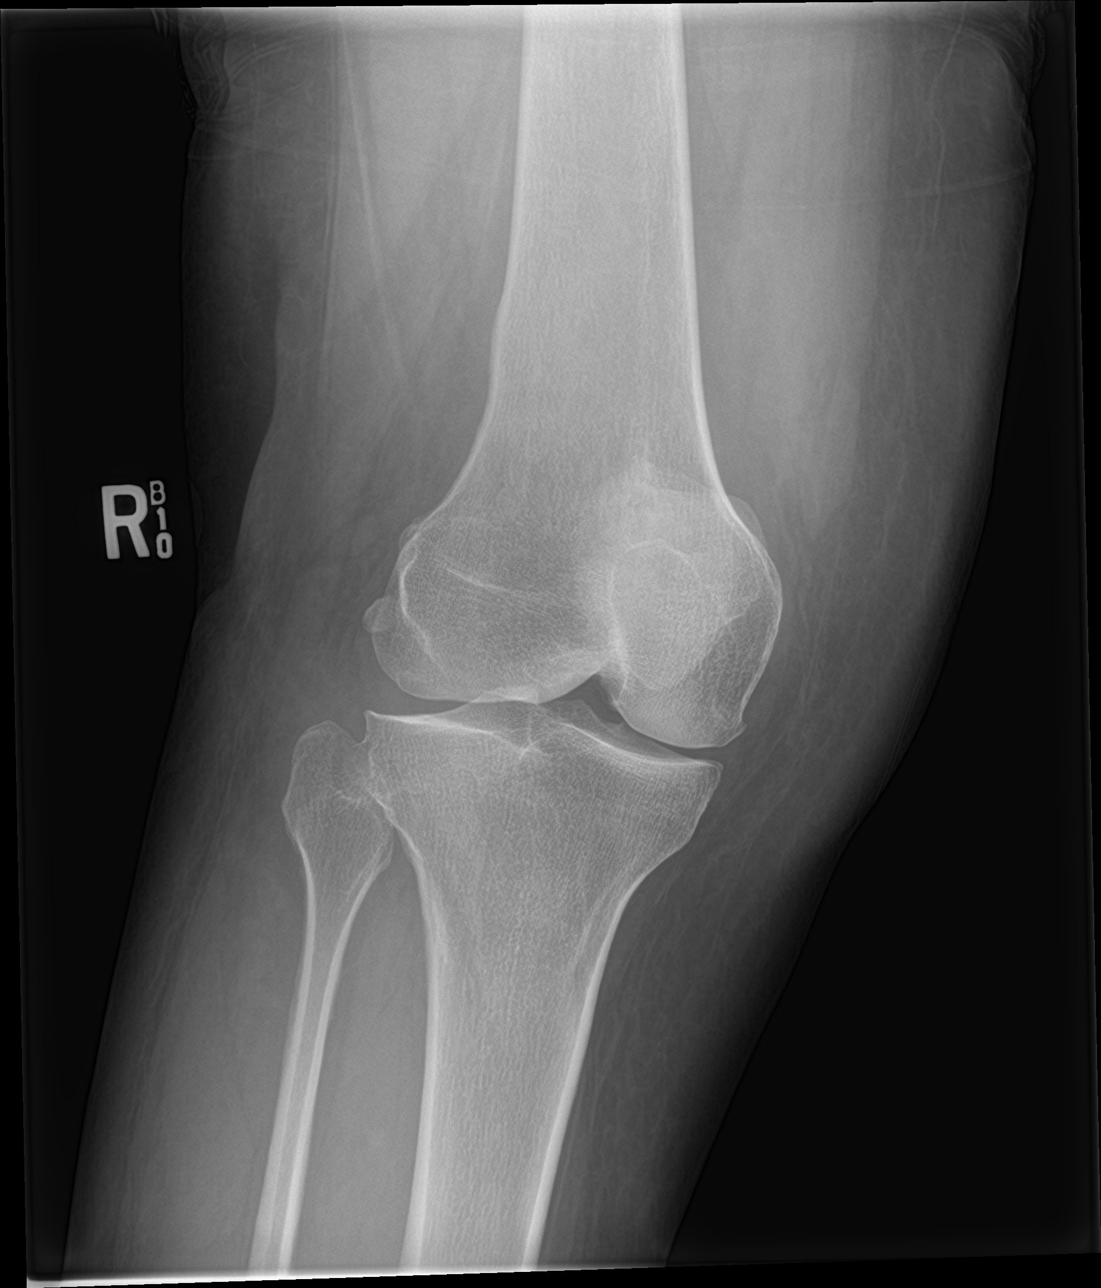

[4 of 4 positions shown; findings below may reference images not displayed]

FINDINGS: Mild medial tibiofemoral joint space narrowing. Minimal lateral
tibiofemoral joint space narrowing. Minimal spurring posterior
aspect of the patella suggesting minimal patellofemoral joint
degenerative changes. No fracture or dislocation. No evidence of
joint effusion.
IMPRESSION: 1. Mild medial tibiofemoral joint space narrowing.
2. Minimal lateral tibiofemoral joint space narrowing.
3. Minimal spurring posterior aspect of the patella suggesting
minimal patellofemoral joint degenerative changes.

## 2020-01-22 ENCOUNTER — Emergency Department (HOSPITAL_COMMUNITY): Payer: BLUE CROSS/BLUE SHIELD

## 2020-01-22 ENCOUNTER — Emergency Department (HOSPITAL_COMMUNITY)
Admission: EM | Admit: 2020-01-22 | Discharge: 2020-01-23 | Disposition: A | Discharge status: Home / Self Care | Payer: BLUE CROSS/BLUE SHIELD | Attending: Emergency Medicine | Admitting: Emergency Medicine

## 2020-01-22 ENCOUNTER — Other Ambulatory Visit: Payer: Self-pay

## 2020-01-22 ENCOUNTER — Encounter (HOSPITAL_COMMUNITY): Payer: Self-pay | Admitting: Pharmacy Technician

## 2020-01-22 DIAGNOSIS — Z5321 Procedure and treatment not carried out due to patient leaving prior to being seen by health care provider: Secondary | ICD-10-CM | POA: Diagnosis not present

## 2020-01-22 DIAGNOSIS — M545 Low back pain: Secondary | ICD-10-CM | POA: Diagnosis not present

## 2020-01-22 DIAGNOSIS — R079 Chest pain, unspecified: Secondary | ICD-10-CM | POA: Diagnosis present

## 2020-01-22 LAB — BASIC METABOLIC PANEL
Anion gap: 9 (ref 5–15)
BUN: 12 mg/dL (ref 6–20)
CO2: 26 mmol/L (ref 22–32)
Calcium: 9.6 mg/dL (ref 8.9–10.3)
Chloride: 103 mmol/L (ref 98–111)
Creatinine, Ser: 0.98 mg/dL (ref 0.44–1.00)
GFR calc Af Amer: >60 mL/min (ref >60)
GFR calc non Af Amer: >60 mL/min (ref >60)
Glucose, Bld: 113 mg/dL — ABNORMAL HIGH (ref 70–99)
Potassium: 3.9 mmol/L (ref 3.5–5.1)
Sodium: 138 mmol/L (ref 135–145)

## 2020-01-22 LAB — CBC
HCT: 38.6 % (ref 36.0–46.0)
Hemoglobin: 12.1 g/dL (ref 12.0–15.0)
MCH: 27.6 pg (ref 26.0–34.0)
MCHC: 31.3 g/dL (ref 30.0–36.0)
MCV: 87.9 fL (ref 80.0–100.0)
Platelets: 219 10*3/uL (ref 150–400)
RBC: 4.39 MIL/uL (ref 3.87–5.11)
RDW: 13.7 % (ref 11.5–15.5)
WBC: 7.3 10*3/uL (ref 4.0–10.5)
nRBC: 0 % (ref 0.0–0.2)

## 2020-01-22 LAB — I-STAT BETA HCG BLOOD, ED (MC, WL, AP ONLY): I-stat hCG, quantitative: <5 m[IU]/mL (ref <5)

## 2020-01-22 LAB — TROPONIN I (HIGH SENSITIVITY)
Troponin I (High Sensitivity): 3 ng/L (ref <18)
Troponin I (High Sensitivity): <2 ng/L (ref <18)

## 2020-01-22 NOTE — ED Triage Notes (Signed)
Pt here pov with reports of chest pain onset yesterday. States she took aspirin yesterday and the pain subsided. Reports the pain came back today more severe.

## 2021-02-12 IMAGING — CR DG CHEST 2V
2 series · 2 of 2 positions shown · non-contrast
Comparison: None.

CLINICAL DATA: Chest pain for 2 days

EXAM:
CHEST - 2 VIEW

[chest pa]
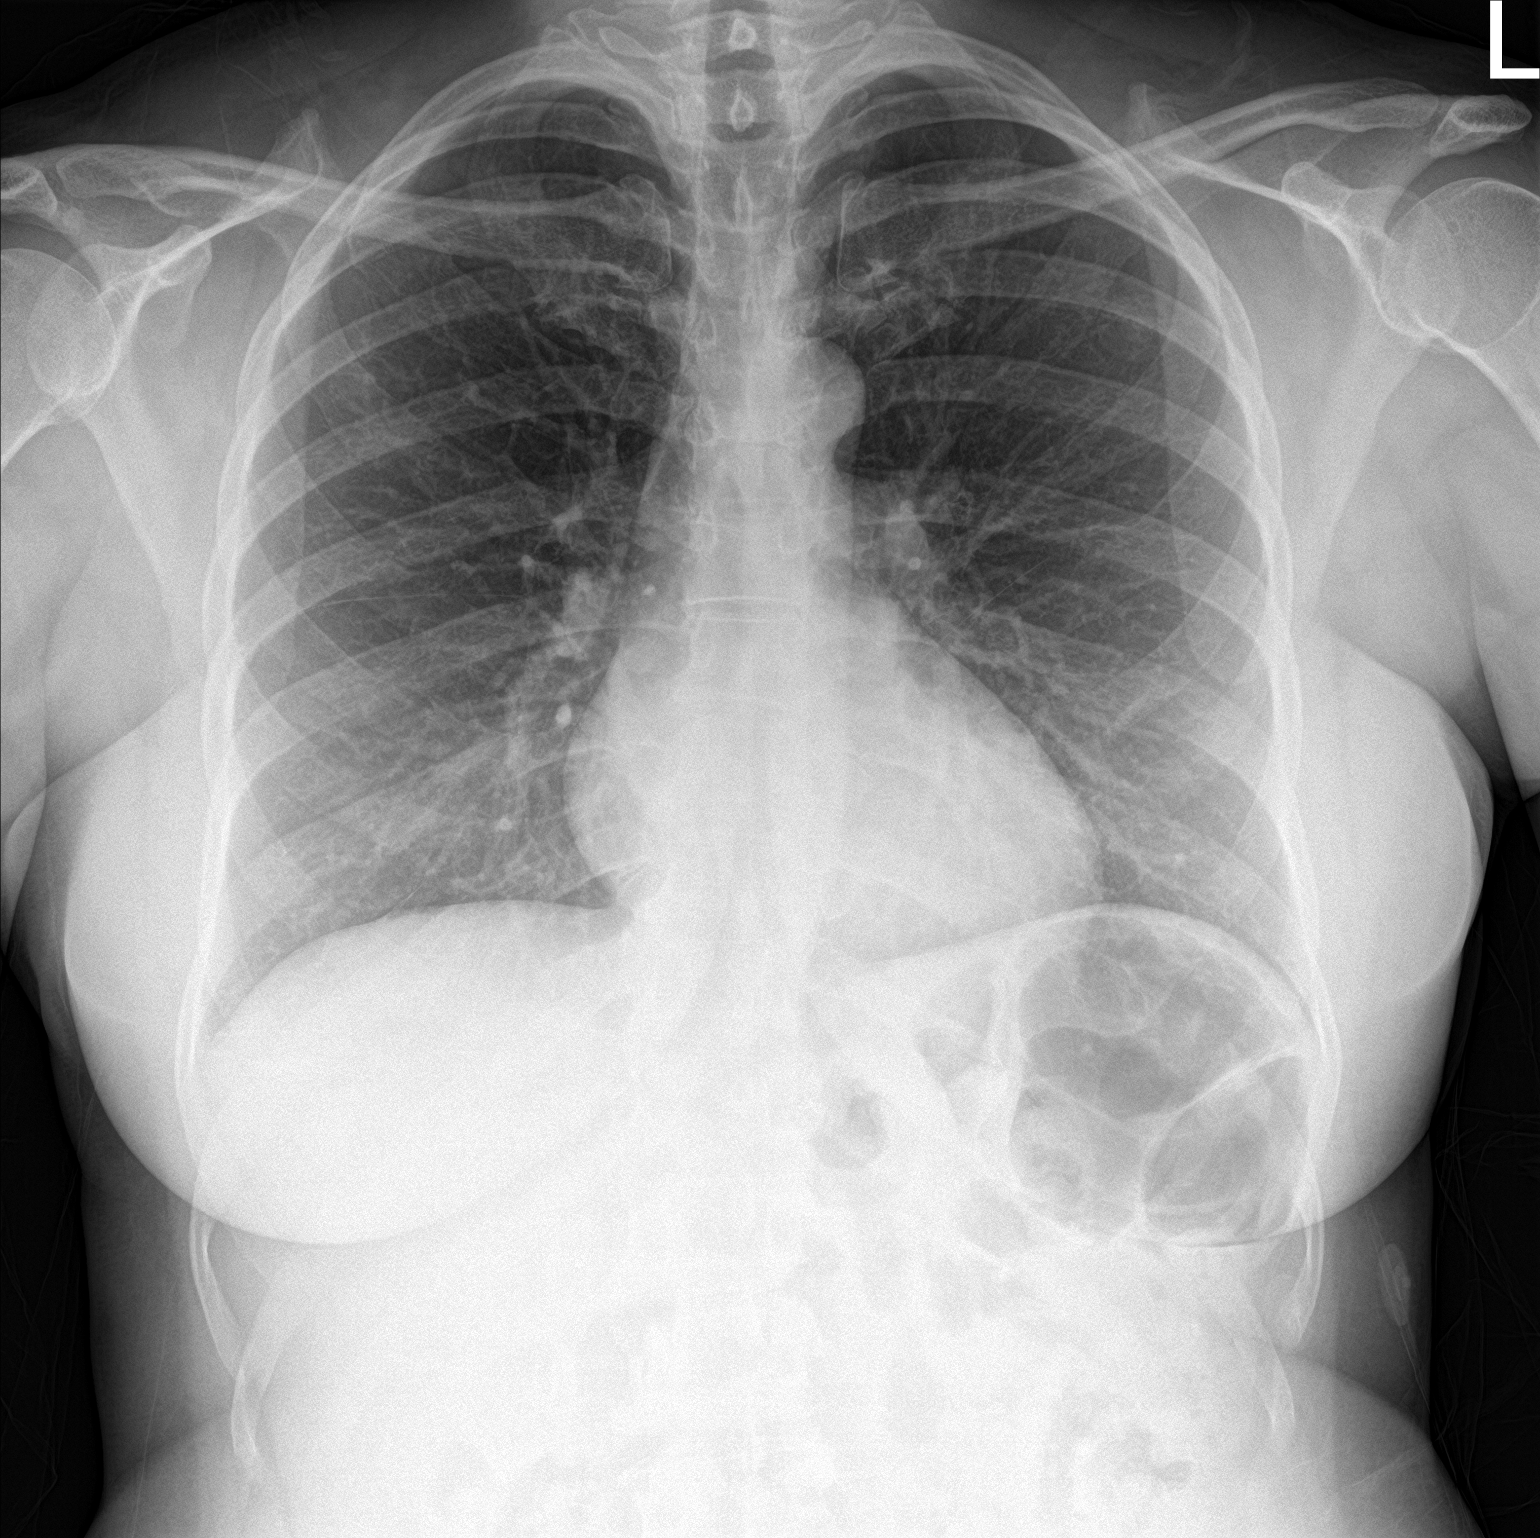

[chest lat]
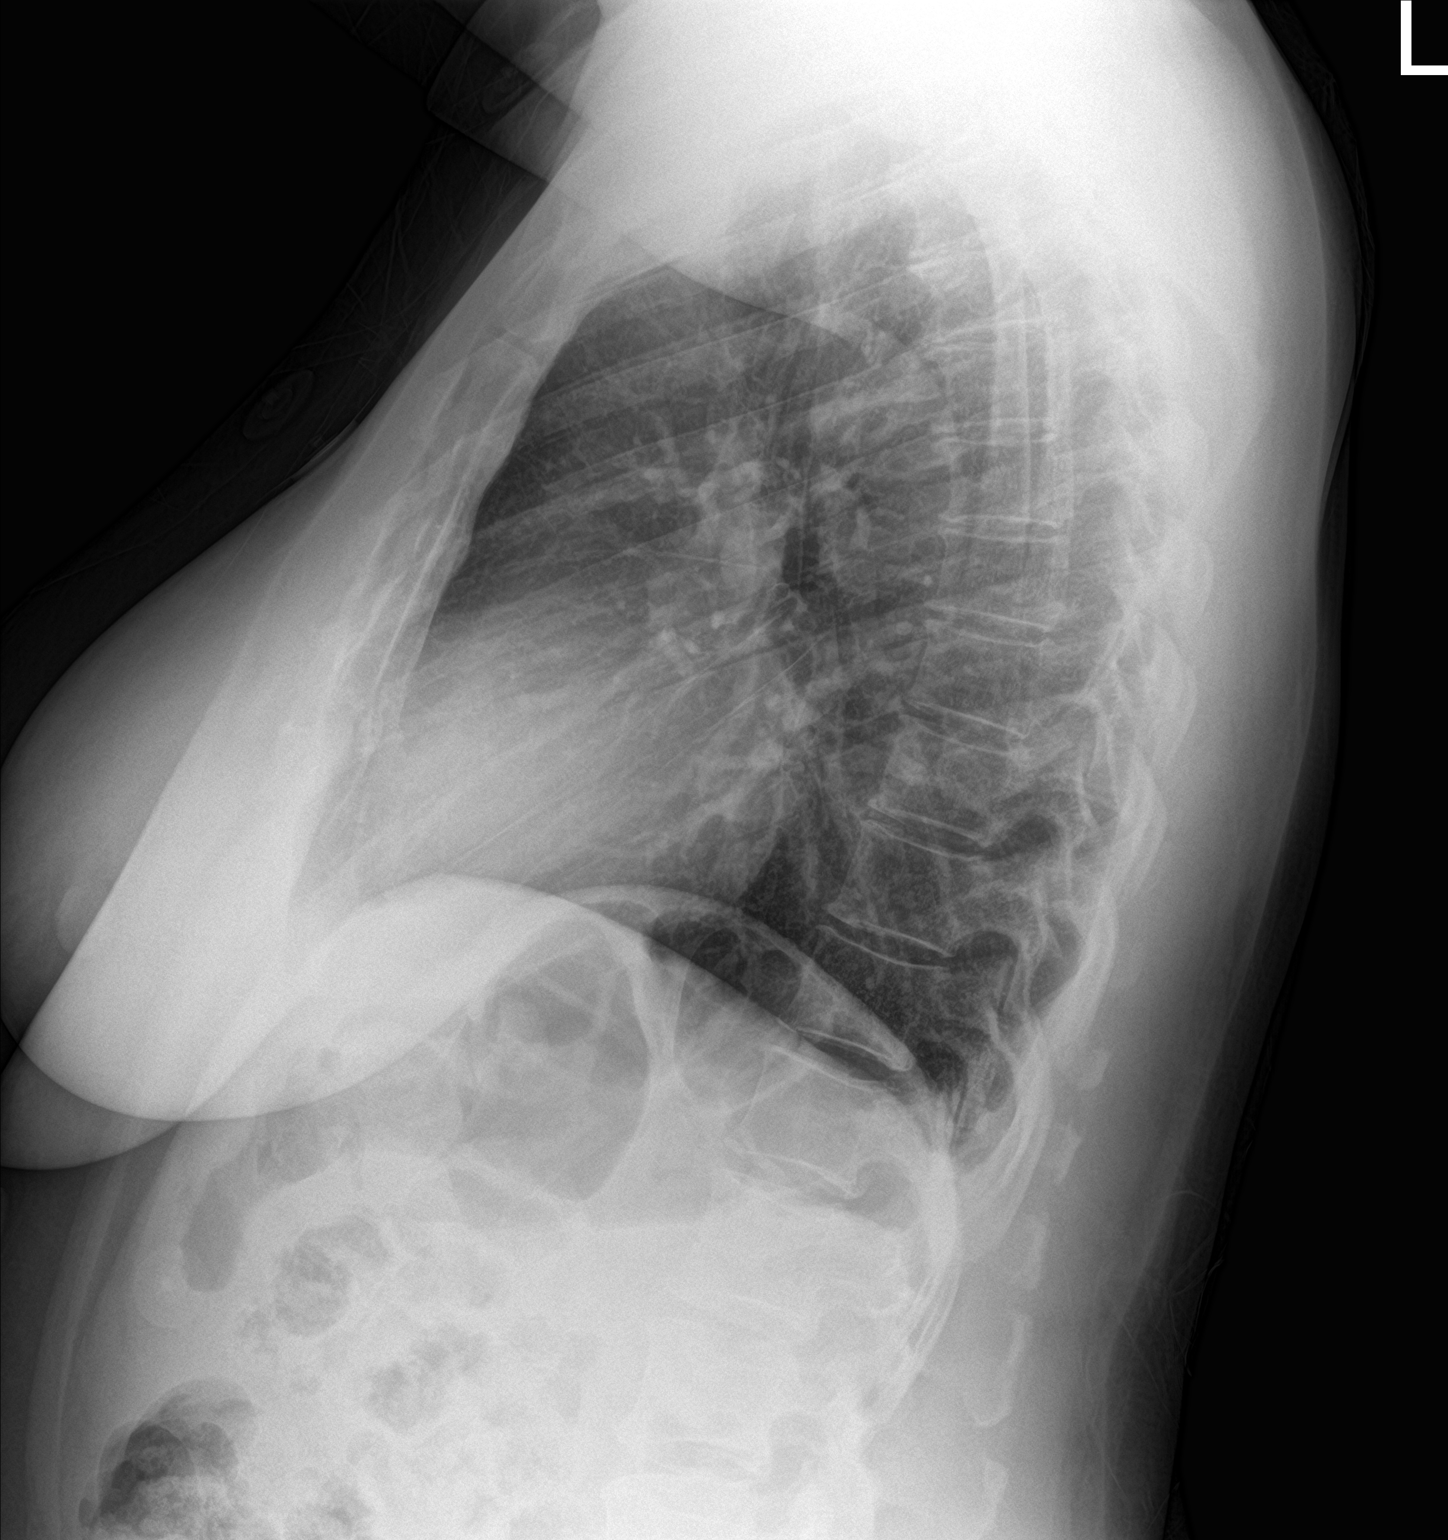

[2 of 2 positions shown; findings below may reference images not displayed]

FINDINGS: The heart size and mediastinal contours are within normal limits.
Both lungs are clear. The visualized skeletal structures are
unremarkable.
IMPRESSION: No active cardiopulmonary disease.

## 2021-10-25 ENCOUNTER — Other Ambulatory Visit: Payer: Self-pay

## 2021-10-25 ENCOUNTER — Emergency Department (HOSPITAL_COMMUNITY)
Admission: EM | Admit: 2021-10-25 | Discharge: 2021-10-26 | Discharge status: Against Medical Advice | Payer: Medicaid Other | Attending: Emergency Medicine | Admitting: Emergency Medicine

## 2021-10-25 DIAGNOSIS — Z5321 Procedure and treatment not carried out due to patient leaving prior to being seen by health care provider: Secondary | ICD-10-CM | POA: Insufficient documentation

## 2021-10-25 DIAGNOSIS — R739 Hyperglycemia, unspecified: Secondary | ICD-10-CM | POA: Insufficient documentation

## 2021-10-25 DIAGNOSIS — H538 Other visual disturbances: Secondary | ICD-10-CM | POA: Diagnosis present

## 2021-10-25 DIAGNOSIS — R42 Dizziness and giddiness: Secondary | ICD-10-CM | POA: Insufficient documentation

## 2021-10-25 LAB — URINALYSIS, ROUTINE W REFLEX MICROSCOPIC
Bacteria, UA: NONE SEEN
Bilirubin Urine: NEGATIVE
Glucose, UA: >=500 mg/dL — AB
Hgb urine dipstick: NEGATIVE
Ketones, ur: 20 mg/dL — AB
Leukocytes,Ua: NEGATIVE
Nitrite: NEGATIVE
Protein, ur: NEGATIVE mg/dL
Specific Gravity, Urine: 1.035 — ABNORMAL HIGH (ref 1.005–1.030)
pH: 5 (ref 5.0–8.0)

## 2021-10-25 LAB — BASIC METABOLIC PANEL
Anion gap: 11 (ref 5–15)
BUN: 16 mg/dL (ref 6–20)
CO2: 23 mmol/L (ref 22–32)
Calcium: 9.6 mg/dL (ref 8.9–10.3)
Chloride: 100 mmol/L (ref 98–111)
Creatinine, Ser: 0.84 mg/dL (ref 0.44–1.00)
GFR, Estimated: >60 mL/min (ref >60)
Glucose, Bld: 340 mg/dL — ABNORMAL HIGH (ref 70–99)
Potassium: 4.1 mmol/L (ref 3.5–5.1)
Sodium: 134 mmol/L — ABNORMAL LOW (ref 135–145)

## 2021-10-25 LAB — CBC
HCT: 40.4 % (ref 36.0–46.0)
Hemoglobin: 13.3 g/dL (ref 12.0–15.0)
MCH: 27.4 pg (ref 26.0–34.0)
MCHC: 32.9 g/dL (ref 30.0–36.0)
MCV: 83.3 fL (ref 80.0–100.0)
Platelets: 243 10*3/uL (ref 150–400)
RBC: 4.85 MIL/uL (ref 3.87–5.11)
RDW: 12.9 % (ref 11.5–15.5)
WBC: 6.5 10*3/uL (ref 4.0–10.5)
nRBC: 0 % (ref 0.0–0.2)

## 2021-10-25 LAB — CBG MONITORING, ED: Glucose-Capillary: 301 mg/dL — ABNORMAL HIGH (ref 70–99)

## 2021-10-25 LAB — I-STAT BETA HCG BLOOD, ED (MC, WL, AP ONLY): I-stat hCG, quantitative: <5 m[IU]/mL (ref <5)

## 2021-10-25 NOTE — ED Triage Notes (Signed)
Patient here with high glucose levels.  She was seen by her PCP last week and was given some Lantus, but she continues with high blood sugar levels. Patient states that she has been having some lightheadedness and blurred vision.  She is feeling shakey due to the high levels.  She states that she is up in the 250's, she is usually down in the 160's.   ?

## 2021-10-26 NOTE — ED Notes (Signed)
Pt seen leaving ED
